# Patient Record
Sex: Female | Born: 1965 | Race: Black or African American | Hispanic: No | Marital: Single | Smoking: Never smoker
Health system: Southern US, Community
[De-identification: ages and names within clinical notes are randomized; demographics above are authoritative.]

## PROBLEM LIST (undated history)

## (undated) DIAGNOSIS — G4733 Obstructive sleep apnea (adult) (pediatric): Secondary | ICD-10-CM

## (undated) DIAGNOSIS — J302 Other seasonal allergic rhinitis: Secondary | ICD-10-CM

## (undated) DIAGNOSIS — J45909 Unspecified asthma, uncomplicated: Secondary | ICD-10-CM

## (undated) DIAGNOSIS — E559 Vitamin D deficiency, unspecified: Secondary | ICD-10-CM

## (undated) DIAGNOSIS — M7989 Other specified soft tissue disorders: Secondary | ICD-10-CM

## (undated) DIAGNOSIS — R0602 Shortness of breath: Secondary | ICD-10-CM

## (undated) DIAGNOSIS — E039 Hypothyroidism, unspecified: Secondary | ICD-10-CM

## (undated) DIAGNOSIS — E739 Lactose intolerance, unspecified: Secondary | ICD-10-CM

## (undated) DIAGNOSIS — M2550 Pain in unspecified joint: Secondary | ICD-10-CM

## (undated) HISTORY — DX: Obstructive sleep apnea (adult) (pediatric): G47.33

## (undated) HISTORY — DX: Other specified soft tissue disorders: M79.89

## (undated) HISTORY — DX: Hypothyroidism, unspecified: E03.9

## (undated) HISTORY — DX: Lactose intolerance, unspecified: E73.9

## (undated) HISTORY — DX: Pain in unspecified joint: M25.50

## (undated) HISTORY — DX: Shortness of breath: R06.02

## (undated) HISTORY — PX: ABDOMINAL HYSTERECTOMY: SHX81

## (undated) HISTORY — DX: Vitamin D deficiency, unspecified: E55.9

---

## 2005-08-28 ENCOUNTER — Ambulatory Visit (HOSPITAL_COMMUNITY): Admission: RE | Admit: 2005-08-28 | Discharge: 2005-08-28 | Payer: Self-pay | Admitting: Gastroenterology

## 2005-09-03 ENCOUNTER — Encounter: Admission: RE | Admit: 2005-09-03 | Discharge: 2005-09-03 | Payer: Self-pay | Admitting: Gastroenterology

## 2005-09-27 ENCOUNTER — Ambulatory Visit (HOSPITAL_COMMUNITY): Admission: RE | Admit: 2005-09-27 | Discharge: 2005-09-27 | Payer: Self-pay | Admitting: *Deleted

## 2005-09-27 ENCOUNTER — Encounter (INDEPENDENT_AMBULATORY_CARE_PROVIDER_SITE_OTHER): Payer: Self-pay | Admitting: Specialist

## 2005-10-02 ENCOUNTER — Ambulatory Visit (HOSPITAL_COMMUNITY): Admission: RE | Admit: 2005-10-02 | Discharge: 2005-10-02 | Payer: Self-pay | Admitting: *Deleted

## 2009-12-21 ENCOUNTER — Emergency Department (HOSPITAL_COMMUNITY): Admission: EM | Admit: 2009-12-21 | Discharge: 2009-12-21 | Payer: Self-pay | Admitting: Emergency Medicine

## 2010-11-30 ENCOUNTER — Emergency Department (HOSPITAL_COMMUNITY): Payer: BC Managed Care – PPO

## 2010-11-30 ENCOUNTER — Emergency Department (HOSPITAL_COMMUNITY)
Admission: EM | Admit: 2010-11-30 | Discharge: 2010-11-30 | Disposition: A | Discharge status: Home / Self Care | Payer: BC Managed Care – PPO | Attending: Emergency Medicine | Admitting: Emergency Medicine

## 2010-11-30 DIAGNOSIS — X58XXXA Exposure to other specified factors, initial encounter: Secondary | ICD-10-CM | POA: Insufficient documentation

## 2010-11-30 DIAGNOSIS — S335XXA Sprain of ligaments of lumbar spine, initial encounter: Secondary | ICD-10-CM | POA: Insufficient documentation

## 2010-11-30 DIAGNOSIS — E039 Hypothyroidism, unspecified: Secondary | ICD-10-CM | POA: Insufficient documentation

## 2010-11-30 DIAGNOSIS — M549 Dorsalgia, unspecified: Secondary | ICD-10-CM | POA: Insufficient documentation

## 2010-11-30 LAB — URINALYSIS, ROUTINE W REFLEX MICROSCOPIC
Hgb urine dipstick: NEGATIVE
Ketones, ur: NEGATIVE mg/dL
Nitrite: NEGATIVE
Specific Gravity, Urine: 1.014 (ref 1.005–1.030)
Urobilinogen, UA: 0.2 mg/dL (ref 0.0–1.0)
pH: 6.5 (ref 5.0–8.0)

## 2010-11-30 LAB — URINE MICROSCOPIC-ADD ON

## 2010-12-14 NOTE — Op Note (Signed)
NAME:  Anne Fitzpatrick, Anne Fitzpatrick               ACCOUNT NO.:  [id number]   MEDICAL RECORD NO.:  [id number]          PATIENT TYPE:  AMB   LOCATION:  SDC                           FACILITY:  [facility]   PHYSICIAN:  Bascom B. Earlene Plater, M.D.  DATE OF BIRTH:  1966/03/24   DATE OF PROCEDURE:  10/02/2005  DATE OF DISCHARGE:                                 OPERATIVE REPORT   PREOPERATIVE DIAGNOSES:  1.  Dysmenorrhea.  2.  Menorrhagia.   POSTOPERATIVE DIAGNOSES:  1.  Dysmenorrhea.  2.  Menorrhagia.   OPERATION/PROCEDURE:  Open diagnostic laparoscopy.   SURGEON:  Chester Holstein. Earlene Plater, M.D.   ASSISTANT:  Lenoard Aden, M.D.   ANESTHESIA:  General.   SPECIMENS:  None.   ESTIMATED BLOOD LOSS:  None.   COMPLICATIONS:  None.   INDICATIONS:  The patient presented for a laparoscopic supracervical  hysterectomy with the above symptoms.  Previous hysteroscopy and dilatation  and curettage showed benign proliferative endometrium.  No signs of  hyperplasia or carcinoma.  No history of abnormal Pap smears.  The patient  was advised of the risks of surgery including infection, bleeding, damage to  surrounding organs, and potential need for a laparotomy.   DESCRIPTION OF PROCEDURE:  The patient was taken to the operating room and  general anesthesia was obtained.  She was placed in the Combs stirrups and  prepped and draped in the standard fashion.  Foley catheter inserted in the  bladder.  Hulka tenaculum was attached to the anterior lip of the cervix.   A 10 mm incision was placed in the umbilicus, carried sharply to the fascia.  The fascia was divided sharply and elevated with Kocher clamps.  Posterior  sheath and peritoneum were elevated sharply.  Omental adhesions of the  umbilicus were taken down sharply.  Pursestring suture  of 0 Vicryl placed  around the fascial defect.  Hasson cannula inserted and secured.  Pneumoperitoneum obtained with CO2 gas.  Trendelenburg position obtained.  An 11 mm Excel trocar  was placed in the left lower quadrant and 5 mm in the  right, each under direct laparoscopic visualization.  Pelvis was inspected  and the uterus was enlarged consistent with fibroids, approximately 12-13  week size.  However, the uterus was densely adherent to the rectum  posteriorly.  There was findings consistent with endometriosis in this same  area.  In addition, the left tube and ovary were adherent to the left pelvic  sidewall.  There were no mobility and no obvious plane of dissection to  safely take down laparoscopically or with laparotomy considering the patient  has not had a bowel prep.  The upper abdomen was inspected and was normal.  Given that the patient had not had a bowel prep and there were dense  adhesions to the rectum, I terminated the procedure.   The inferior pots were removed, their sites inspected.  There were  hemostatic.  Scope was removed and gas released.  Hasson cannula removed.  The umbilical incision was elevated with an [employer] retractor.  Pursestring suture closed down.  This obliterated the fascial defect.  No  intra-abdominal contents were noted prior to closure.  Skin was closed in  the umbilicus with 4-0 Vicryl.  Skin was closed with anterior ports with  Dermabond.  Hulka removed.  Cervix was hemostatic.   The patient tolerated the procedure well. There were no complications.  She  was taken to the recovery room awake, alert and in stable condition.      Gerri Spore B. Earlene Plater, M.D.  Electronically Signed     WBD/MEDQ  D:  10/02/2005  T:  10/02/2005  Job:  [id number]

## 2010-12-14 NOTE — Op Note (Signed)
NAME:  Anne Fitzpatrick, Anne Fitzpatrick               ACCOUNT NO.:  [id number]   MEDICAL RECORD NO.:  [id number]          PATIENT TYPE:  AMB   LOCATION:  ENDO                         FACILITY:  [facility]   PHYSICIAN:  Anselmo Rod, M.D.  DATE OF BIRTH:  [birth year]   DATE OF PROCEDURE:  08/28/2005  DATE OF DISCHARGE:                                 OPERATIVE REPORT   PROCEDURE:  Esophagogastroduodenoscopy.   ENDOSCOPIST:  Charna Elizabeth, M.D.   INSTRUMENT USED:  Olympus video panendoscope.   INDICATIONS FOR PROCEDURE:  45 year old African American female with a  history of severe epigastric pain and nausea radiating to her back  undergoing an EGD to rule out esophagitis versus peptic ulcer disease from  non-steroidal use.   PREPROCEDURE PREPARATION:  Informed consent was obtained from the patient.  The patient was fasted for eight hours prior to the procedure.  The risks  and benefits of the procedure were discussed with the patient.   PREPROCEDURE PHYSICAL:  Patient with stable vital signs.  Neck supple.  Chest clear to auscultation.  S1 and S2 regular.  Abdomen soft with normal  bowel sounds.   DESCRIPTION OF PROCEDURE:  The patient was placed in the left lateral  decubitus position, sedated with 60 mcg of Fentanyl and 6 mg Versed in slow  incremental doses.  Once the patient was adequately sedated, maintained on  low flow oxygen and continuous cardiac monitoring, the Olympus video  panendoscope was advanced through the mouth piece over the tongue into the  esophagus under direct vision.  The entire esophagus appeared normal with no  evidence of ring, strictures, masses, esophagitis, or Barrett's mucosa.  The  esophagus was widely patent.  The gastric mucosa appeared healthy.  A small  hiatal hernia was noted on high retroflexion.  The rest of the gastric  mucosa and proximal small bowel appeared normal.  There was no obstruction.   IMPRESSION:  Normal esophagogastroduodenoscopy except for small  hiatal  hernia, no masses, ulcers, erosions, etc., seen.   RECOMMENDATIONS:  1.Proceed with a HIDA scan.  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  Therefore, in view of the fact that she  has had a normal gallbladder ultrasound on August 21, 2005, the plans are  to proceed with a HIDA scan with CBD injection.  Further recommendations  will be made thereafter.  A low fat diet has been recommended for now.  2.Follow anti-reflux measures, avoid all non-steroidals.  3.Continue PPI.  4.Outpatient follow up after the HIDA scan has been done.      Anselmo Rod, M.D.  Electronically Signed     JNM/MEDQ  D:  08/28/2005  T:  08/28/2005  Job:  [id number]   cc:   Gerri Spore B. Earlene Plater, M.D.  Fax: [phone]   Kirstie Peri, MD  Fax: [phone]

## 2010-12-14 NOTE — Op Note (Signed)
NAME:  Anne Fitzpatrick, Anne Fitzpatrick               ACCOUNT NO.:  [id number]   MEDICAL RECORD NO.:  [id number]          PATIENT TYPE:  AMB   LOCATION:  SDC                           FACILITY:  [facility]   PHYSICIAN:  [place] B. Earlene Plater, M.D.  DATE OF BIRTH:  May 06, 1966   DATE OF PROCEDURE:  09/27/2005  DATE OF DISCHARGE:                                 OPERATIVE REPORT   PREOPERATIVE DIAGNOSIS:  Abnormal uterine bleeding.   POSTOPERATIVE DIAGNOSIS:  Abnormal uterine bleeding.   OPERATION/PROCEDURE:  Hysteroscopy and dilatation and curettage with  ultrasound guidance.   SURGEON:  Chester Holstein. Earlene Plater, M.D.   ASSISTANT:  None.   ANESTHESIA:  LMA, general and 10 mL 1% Nesacaine paracervical block.   SPECIMENS:  Endometrial curettings.   ESTIMATED BLOOD LOSS:  Minimal.   COMPLICATIONS:  None.   INDICATIONS:  The patient with a history of heavy and irregular menstrual  periods.  She was planning for hysterectomy next week.  After a preoperative  visit endometrial biopsy was attempted but the cavity could not be entered.  A second attempt made with ultrasound guidance and was still unsuccessful.  Therefore, prior to her hysterectomy to rule out any significant endometrial  pathology, the patient presents for dilatation and curettage under  ultrasound guidance and hysteroscopy.  The patient was advised of the risks  of surgery including infection, bleeding, uterine perforation, and damage to  surrounding organs.   DESCRIPTION OF PROCEDURE:  The patient was taken to the operating room and  LMA general anesthesia was obtained.  She was prepped and draped in the  standard fashion and the bladder kept full to facilitate ultrasound  guidance.  Speculum inserted.  Paracervical block placed.  Single-tooth  tenaculum attached to the anterior lip of the cervix.  Under ultrasound  guidance, the uterus was sounded to about 7 cm.  It was noted to sharply  turn from the anterior cervical os anteriorly into the uterine  cavity and  this was apparently why it was difficult in the office.  Cervix was then  easily dilated under ultrasound guidance to #21.  The endometrium was then  curetted under ultrasound guidance and scanty amount of tissue returned.  The hysteroscope was inserted into the cavity without difficulty.  However,  good uterine distention could not be obtained.  What could be seen was a  shaggy endometrium and no focal mass.   The procedure was terminated.  The patient tolerated the procedure well.  There were no complications.  Instruments were removed and cervix was  hemostatic. She was taken to the recovery room awake, alert, in stable  condition.      Gerri Spore B. Earlene Plater, M.D.  Electronically Signed     WBD/MEDQ  D:  09/27/2005  T:  09/28/2005  Job:  [id number]

## 2011-05-23 ENCOUNTER — Emergency Department (HOSPITAL_COMMUNITY)
Admission: EM | Admit: 2011-05-23 | Discharge: 2011-05-24 | Disposition: A | Discharge status: Home / Self Care | Payer: BC Managed Care – PPO | Attending: Emergency Medicine | Admitting: Emergency Medicine

## 2011-05-23 DIAGNOSIS — E039 Hypothyroidism, unspecified: Secondary | ICD-10-CM | POA: Insufficient documentation

## 2011-05-23 DIAGNOSIS — R0989 Other specified symptoms and signs involving the circulatory and respiratory systems: Secondary | ICD-10-CM | POA: Insufficient documentation

## 2011-05-23 DIAGNOSIS — Z79899 Other long term (current) drug therapy: Secondary | ICD-10-CM | POA: Insufficient documentation

## 2011-05-23 DIAGNOSIS — M7989 Other specified soft tissue disorders: Secondary | ICD-10-CM | POA: Insufficient documentation

## 2011-05-23 DIAGNOSIS — R0609 Other forms of dyspnea: Secondary | ICD-10-CM | POA: Insufficient documentation

## 2011-05-24 ENCOUNTER — Emergency Department (HOSPITAL_COMMUNITY): Payer: BC Managed Care – PPO

## 2011-05-24 ENCOUNTER — Encounter (HOSPITAL_COMMUNITY): Payer: Self-pay

## 2011-05-24 LAB — DIFFERENTIAL
Lymphocytes Relative: 37 % (ref 12–46)
Lymphs Abs: 2.1 10*3/uL (ref 0.7–4.0)
Monocytes Relative: 8 % (ref 3–12)
Neutro Abs: 2.8 10*3/uL (ref 1.7–7.7)
Neutrophils Relative %: 50 % (ref 43–77)

## 2011-05-24 LAB — CBC
MCH: 28 pg (ref 26.0–34.0)
MCHC: 31.8 g/dL (ref 30.0–36.0)
MCV: 88.1 fL (ref 78.0–100.0)
Platelets: 305 10*3/uL (ref 150–400)
RDW: 14.2 % (ref 11.5–15.5)

## 2011-05-24 LAB — D-DIMER, QUANTITATIVE: D-Dimer, Quant: 0.62 ug/mL-FEU — ABNORMAL HIGH (ref 0.00–0.48)

## 2011-05-24 LAB — BASIC METABOLIC PANEL
Calcium: 9.4 mg/dL (ref 8.4–10.5)
Chloride: 104 mEq/L (ref 96–112)

## 2011-05-24 MED ORDER — IOHEXOL 300 MG/ML  SOLN
125.0000 mL | Freq: Once | INTRAMUSCULAR | Status: AC | PRN
  Administered 2011-05-24: 125 mL via INTRAVENOUS

## 2012-11-07 IMAGING — CR DG LUMBAR SPINE COMPLETE 4+V
5 series · 5 of 5 positions shown · non-contrast
Comparison: None.

CLINICAL DATA: Obesity, back pain

LUMBAR SPINE - COMPLETE 4+ VIEW

[t l-spine a.p.]
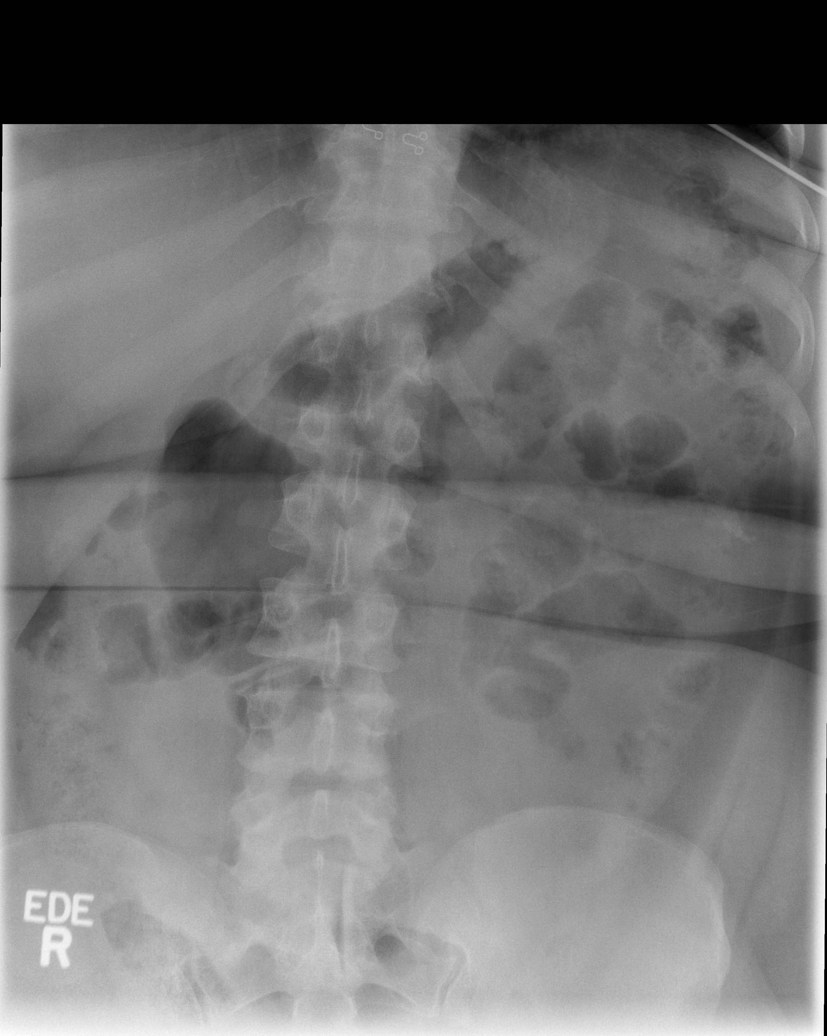

[t l-spine oblique exposure (1 of 2)]
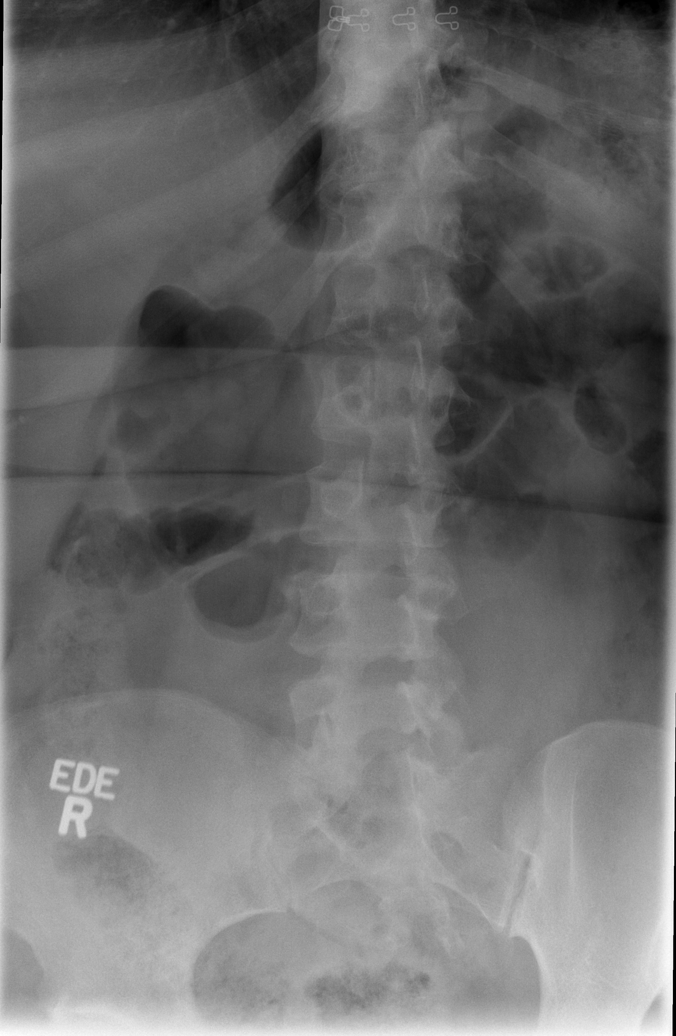

[t l-spine oblique exposure (2 of 2)]
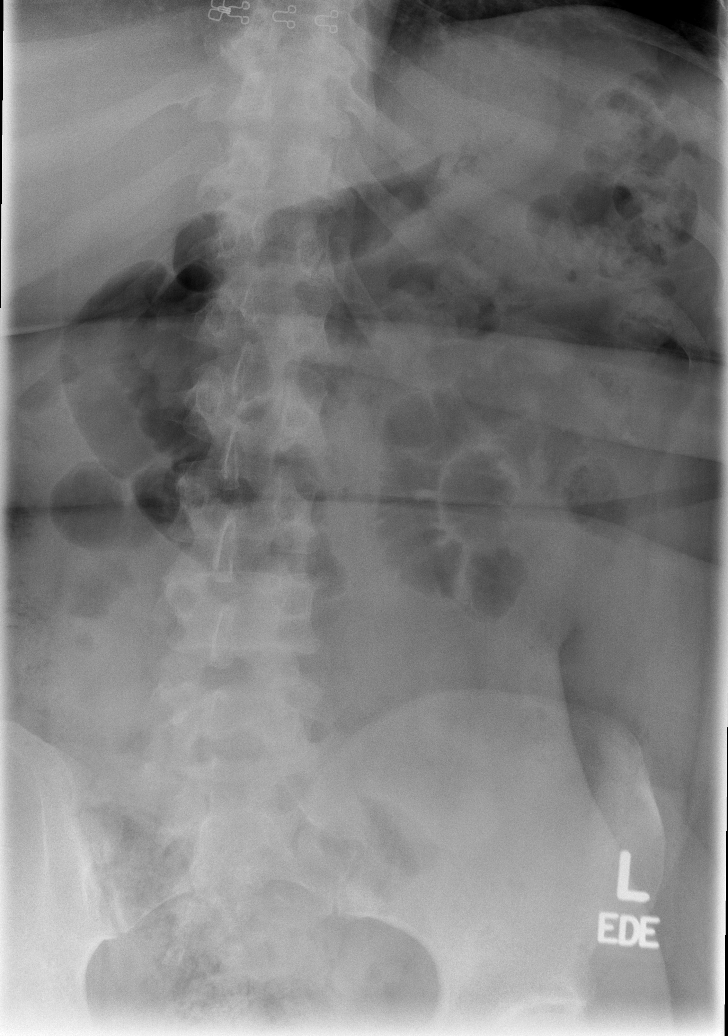

[t l-spine lat]
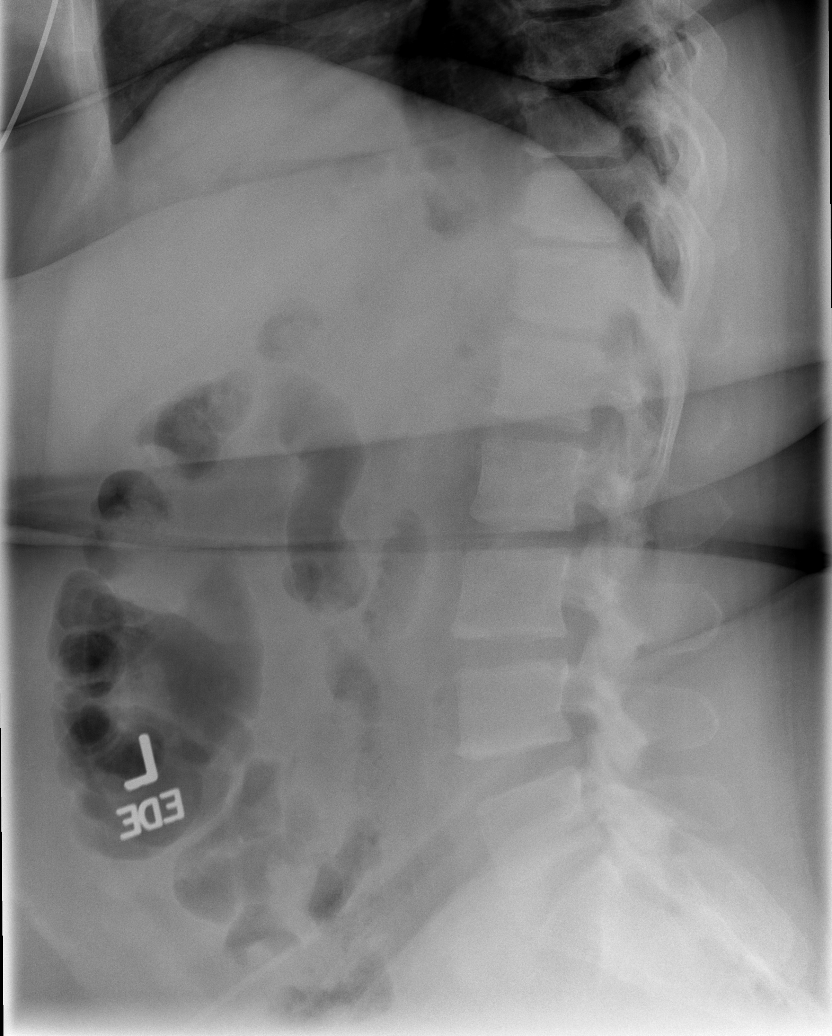

[t l-spine l5-s1 spot *]
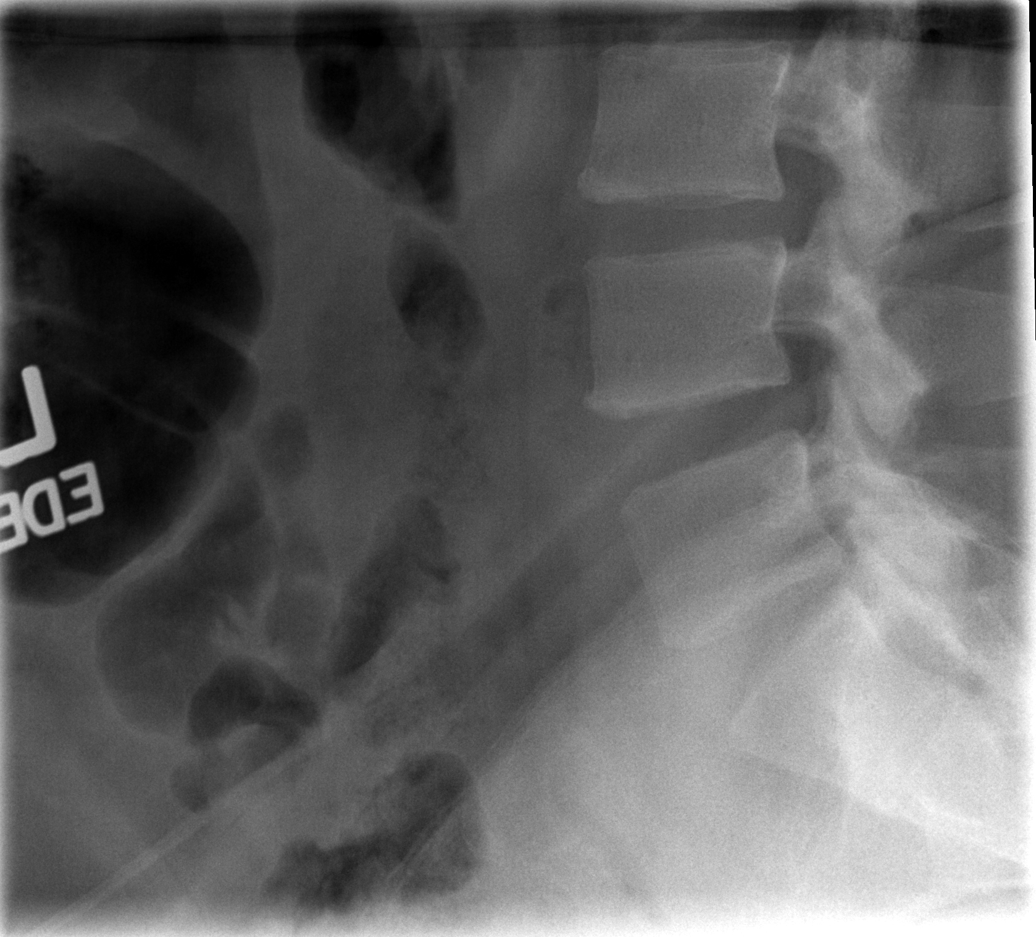

[5 of 5 positions shown; findings below may reference images not displayed]

FINDINGS: Mild lower thoracic and lumbar endplate degenerative
changes.  Normal alignment.  No compression fracture, wedge shaped
deformity or focal kyphosis.  Preserved disc spaces.  Slight facet
arthropathy suspected at L5 S1.  No pars defects.  Intact pedicles.
IMPRESSION: Slight degenerative changes.  No acute finding by plain
radiography.

## 2013-03-14 DIAGNOSIS — R0602 Shortness of breath: Secondary | ICD-10-CM

## 2013-05-30 ENCOUNTER — Encounter (HOSPITAL_COMMUNITY): Payer: Self-pay | Admitting: Emergency Medicine

## 2013-05-30 ENCOUNTER — Emergency Department (HOSPITAL_COMMUNITY): Payer: BC Managed Care – PPO

## 2013-05-30 ENCOUNTER — Emergency Department (HOSPITAL_COMMUNITY)
Admission: EM | Admit: 2013-05-30 | Discharge: 2013-05-30 | Disposition: A | Discharge status: Home / Self Care | Payer: BC Managed Care – PPO | Attending: Emergency Medicine | Admitting: Emergency Medicine

## 2013-05-30 DIAGNOSIS — H9209 Otalgia, unspecified ear: Secondary | ICD-10-CM | POA: Insufficient documentation

## 2013-05-30 DIAGNOSIS — E669 Obesity, unspecified: Secondary | ICD-10-CM | POA: Insufficient documentation

## 2013-05-30 DIAGNOSIS — Z79899 Other long term (current) drug therapy: Secondary | ICD-10-CM | POA: Insufficient documentation

## 2013-05-30 DIAGNOSIS — E039 Hypothyroidism, unspecified: Secondary | ICD-10-CM | POA: Insufficient documentation

## 2013-05-30 DIAGNOSIS — J45909 Unspecified asthma, uncomplicated: Secondary | ICD-10-CM | POA: Insufficient documentation

## 2013-05-30 HISTORY — DX: Unspecified asthma, uncomplicated: J45.909

## 2013-05-30 HISTORY — DX: Hypothyroidism, unspecified: E03.9

## 2013-05-30 HISTORY — DX: Other seasonal allergic rhinitis: J30.2

## 2013-05-30 MED ORDER — FLUTICASONE PROPIONATE HFA 110 MCG/ACT IN AERO: 1.0000 | INHALATION_SPRAY | Freq: Two times a day (BID) | RESPIRATORY_TRACT | Status: DC

## 2013-05-30 NOTE — ED Notes (Signed)
Pt c/o ear pressure, cough that was productive with green sputum, sob, chest pain with the coughing and deep respirations since Thursday. Pt states "I feel congestion in my chest",

## 2013-05-30 NOTE — ED Provider Notes (Signed)
CSN: [id number]     Arrival date & time 05/30/13  1018 History   First MD Initiated Contact with Patient 05/30/13 1301     Chief Complaint  Patient presents with  . Cough   (Consider location/radiation/quality/duration/timing/severity/associated sxs/prior Treatment) Patient is a 47 y.o. female presenting with cough. The history is provided by the patient.  Cough Onset quality:  Sudden Timing:  Constant Chronicity:  Chronic Smoker: no   Associated symptoms: chest pain and ear pain   Associated symptoms: no headaches, no rash and no shortness of breath    patient has had cough since April. She states she was seen in ED and given steroids antibiotics and breathing felt better. She states she's continued to have symptoms. She has had cough with minimal production. States her ears hurt. She states her head feels congested it hurts. Swelling or legs. She does not smoke. She states she has a history of asthma and allergies. She states she was feeling better when she was on Zyrtec-D, but that D was made in her blood pressure go up so she is now only on Zyrtec and Singulair. No fevers. No lightheadedness or dizziness.  Past Medical History  Diagnosis Date  . Hypothyroid   . Asthma   . Seasonal allergies    Past Surgical History  Procedure Laterality Date  . Abdominal hysterectomy     No family history on file. History  Substance Use Topics  . Smoking status: Never Smoker   . Smokeless tobacco: Not on file  . Alcohol Use: No   OB History   Grav Para Term Preterm Abortions TAB SAB Ect Mult Living                 Review of Systems  Constitutional: Negative for activity change and appetite change.  HENT: Positive for ear pain and sinus pressure.   Eyes: Negative for pain.  Respiratory: Positive for cough and chest tightness. Negative for shortness of breath.   Cardiovascular: Positive for chest pain. Negative for leg swelling.  Gastrointestinal: Negative for nausea, vomiting,  abdominal pain and diarrhea.  Genitourinary: Negative for flank pain.  Musculoskeletal: Negative for back pain and neck stiffness.  Skin: Negative for rash.  Neurological: Negative for weakness, numbness and headaches.  Psychiatric/Behavioral: Negative for behavioral problems.    Allergies  Review of patient's allergies indicates no known allergies.  Home Medications   Current Outpatient Rx  Name  Route  Sig  Dispense  Refill  . cetirizine (ZYRTEC) 10 MG tablet   Oral   Take 10 mg by mouth daily.         . cholecalciferol (VITAMIN D) 1000 UNITS tablet   Oral   Take 1,000 Units by mouth daily.         Marland Kitchen guaiFENesin (MUCINEX) 600 MG 12 hr tablet   Oral   Take 1,200 mg by mouth 2 (two) times daily.         . Lorcaserin HCl (BELVIQ) 10 MG TABS   Oral   Take 1 tablet by mouth daily.         . montelukast (SINGULAIR) 10 MG tablet   Oral   Take 1 tablet by mouth daily.         . Multiple Vitamin (MULTIVITAMIN WITH MINERALS) TABS tablet   Oral   Take 1 tablet by mouth daily.         Marland Kitchen SYNTHROID 25 MCG tablet   Oral   Take 1 tablet by mouth daily.         Marland Kitchen  fluticasone (FLOVENT HFA) 110 MCG/ACT inhaler   Inhalation   Inhale 1 puff into the lungs 2 (two) times daily.   1 Inhaler   1    BP 126/97  Pulse 67  Temp(Src) 98.1 F (36.7 C) (Oral)  Resp 20  SpO2 99% Physical Exam  Nursing note and vitals reviewed. Constitutional: She is oriented to person, place, and time. She appears well-developed and well-nourished.  Patient is obese  HENT:  Head: Normocephalic and atraumatic.  Eyes: EOM are normal. Pupils are equal, round, and reactive to light.  Neck: Normal range of motion. Neck supple.  Cardiovascular: Normal rate, regular rhythm and normal heart sounds.   No murmur heard. Pulmonary/Chest: Effort normal and breath sounds normal. No respiratory distress. She has no wheezes. She has no rales.  Abdominal: Soft. Bowel sounds are normal. She exhibits  no distension. There is no tenderness. There is no rebound and no guarding.  Musculoskeletal: Normal range of motion.  Neurological: She is alert and oriented to person, place, and time. No cranial nerve deficit.  Skin: Skin is warm and dry.  Psychiatric: She has a normal mood and affect. Her speech is normal.    ED Course  Procedures (including critical care time) Labs Review Labs Reviewed - No data to display Imaging Review Dg Chest 2 View  05/30/2013   CLINICAL DATA:  Cough. Chest congestion. Asthma. Seasonal allergies.  EXAM: CHEST  2 VIEW  COMPARISON:  04/15/2013  FINDINGS: The heart size and mediastinal contours are within normal limits. Both lungs are clear. The visualized skeletal structures are unremarkable.  IMPRESSION: No active cardiopulmonary disease.   Electronically Signed   By: Myles Rosenthal M.D.   On: 05/30/2013 11:25    EKG Interpretation   None       MDM   1. Asthma    Patient with likely exacerbation of her asthma versus allergic dyspnea. Will adjust medications. Do not feel this is cardiac in cause. X-ray does not show pneumonia. Will add inhaled steroid additionally to follow with her primary care Dr. No current wheezes.    Juliet Rude. Rubin Payor, MD 05/30/13 1344

## 2013-05-30 NOTE — ED Notes (Signed)
Pt c/o chest congestion and cough since Wednesday. Pt states "I feel congested in my chest and I have been trying to cough up everything but I can't". Pt also reports SOB. Pt has inhaler at home but denies relief when using it.

## 2013-06-03 ENCOUNTER — Ambulatory Visit: Payer: BC Managed Care – PPO | Admitting: Dietician

## 2015-06-08 ENCOUNTER — Other Ambulatory Visit: Payer: Self-pay | Admitting: Surgical Oncology

## 2015-07-18 ENCOUNTER — Other Ambulatory Visit: Payer: Self-pay | Admitting: Endocrinology

## 2015-07-18 DIAGNOSIS — E049 Nontoxic goiter, unspecified: Secondary | ICD-10-CM

## 2015-07-26 ENCOUNTER — Other Ambulatory Visit: Payer: BC Managed Care – PPO

## 2015-07-28 ENCOUNTER — Ambulatory Visit
Admission: RE | Admit: 2015-07-28 | Discharge: 2015-07-28 | Disposition: A | Discharge status: Home / Self Care | Payer: BC Managed Care – PPO | Source: Ambulatory Visit | Attending: Endocrinology | Admitting: Endocrinology

## 2015-07-28 DIAGNOSIS — E049 Nontoxic goiter, unspecified: Secondary | ICD-10-CM

## 2015-09-07 ENCOUNTER — Ambulatory Visit: Payer: Self-pay | Admitting: Allergy and Immunology

## 2015-09-20 ENCOUNTER — Ambulatory Visit: Payer: Self-pay | Admitting: Allergy and Immunology

## 2015-10-03 ENCOUNTER — Ambulatory Visit: Payer: Self-pay | Admitting: Allergy and Immunology

## 2015-10-24 ENCOUNTER — Encounter (HOSPITAL_COMMUNITY): Payer: Self-pay

## 2015-10-24 ENCOUNTER — Emergency Department (HOSPITAL_COMMUNITY)
Admission: EM | Admit: 2015-10-24 | Discharge: 2015-10-24 | Disposition: A | Discharge status: Home / Self Care | Payer: BC Managed Care – PPO | Attending: Emergency Medicine | Admitting: Emergency Medicine

## 2015-10-24 DIAGNOSIS — J45909 Unspecified asthma, uncomplicated: Secondary | ICD-10-CM | POA: Diagnosis not present

## 2015-10-24 DIAGNOSIS — M549 Dorsalgia, unspecified: Secondary | ICD-10-CM | POA: Diagnosis present

## 2015-10-24 LAB — URINALYSIS, ROUTINE W REFLEX MICROSCOPIC
BILIRUBIN URINE: NEGATIVE
Glucose, UA: NEGATIVE mg/dL
HGB URINE DIPSTICK: NEGATIVE
Ketones, ur: NEGATIVE mg/dL
Leukocytes, UA: NEGATIVE
Nitrite: NEGATIVE
PROTEIN: NEGATIVE mg/dL
SPECIFIC GRAVITY, URINE: 1.008 (ref 1.005–1.030)
pH: 7 (ref 5.0–8.0)

## 2015-10-24 NOTE — ED Notes (Signed)
Pt notified registration that she was not staying

## 2015-10-24 NOTE — ED Notes (Signed)
Pt has hx of UTI.  Pt went to MD yesterday and was cleared for UTI.  Given muscle relaxer.  Back hurts when attempting to urinate.  Difficulty walking. No new trauma

## 2015-11-03 ENCOUNTER — Other Ambulatory Visit: Payer: Self-pay | Admitting: Surgical Oncology

## 2015-11-03 DIAGNOSIS — J4530 Mild persistent asthma, uncomplicated: Secondary | ICD-10-CM

## 2015-11-07 ENCOUNTER — Other Ambulatory Visit: Payer: BC Managed Care – PPO

## 2015-11-15 ENCOUNTER — Ambulatory Visit
Admission: RE | Admit: 2015-11-15 | Discharge: 2015-11-15 | Disposition: A | Discharge status: Home / Self Care | Payer: BC Managed Care – PPO | Source: Ambulatory Visit | Attending: Surgical Oncology | Admitting: Surgical Oncology

## 2015-11-15 DIAGNOSIS — J4530 Mild persistent asthma, uncomplicated: Secondary | ICD-10-CM

## 2015-11-16 ENCOUNTER — Other Ambulatory Visit: Payer: Self-pay | Admitting: Neurosurgery

## 2015-11-16 DIAGNOSIS — M5137 Other intervertebral disc degeneration, lumbosacral region: Secondary | ICD-10-CM

## 2015-11-23 ENCOUNTER — Other Ambulatory Visit: Payer: BC Managed Care – PPO

## 2015-11-25 ENCOUNTER — Ambulatory Visit
Admission: RE | Admit: 2015-11-25 | Discharge: 2015-11-25 | Disposition: A | Discharge status: Home / Self Care | Payer: BC Managed Care – PPO | Source: Ambulatory Visit | Attending: Neurosurgery | Admitting: Neurosurgery

## 2015-11-25 DIAGNOSIS — M5137 Other intervertebral disc degeneration, lumbosacral region: Secondary | ICD-10-CM

## 2016-07-17 ENCOUNTER — Other Ambulatory Visit: Payer: Self-pay | Admitting: Endocrinology

## 2016-07-17 DIAGNOSIS — E049 Nontoxic goiter, unspecified: Secondary | ICD-10-CM

## 2016-10-09 ENCOUNTER — Ambulatory Visit
Admission: RE | Admit: 2016-10-09 | Discharge: 2016-10-09 | Disposition: A | Discharge status: Home / Self Care | Payer: BC Managed Care – PPO | Source: Ambulatory Visit | Attending: Endocrinology | Admitting: Endocrinology

## 2016-10-09 DIAGNOSIS — E049 Nontoxic goiter, unspecified: Secondary | ICD-10-CM

## 2018-04-02 ENCOUNTER — Other Ambulatory Visit: Payer: Self-pay | Admitting: Obstetrics and Gynecology

## 2018-04-02 DIAGNOSIS — Z1509 Genetic susceptibility to other malignant neoplasm: Principal | ICD-10-CM

## 2018-04-02 DIAGNOSIS — Z1501 Genetic susceptibility to malignant neoplasm of breast: Secondary | ICD-10-CM

## 2018-04-02 DIAGNOSIS — Z1231 Encounter for screening mammogram for malignant neoplasm of breast: Secondary | ICD-10-CM

## 2018-04-03 ENCOUNTER — Ambulatory Visit: Payer: BC Managed Care – PPO

## 2018-07-20 ENCOUNTER — Other Ambulatory Visit: Payer: Self-pay | Admitting: Obstetrics and Gynecology

## 2018-07-26 ENCOUNTER — Ambulatory Visit
Admission: RE | Admit: 2018-07-26 | Discharge: 2018-07-26 | Disposition: A | Discharge status: Home / Self Care | Payer: BC Managed Care – PPO | Source: Ambulatory Visit | Attending: Obstetrics and Gynecology | Admitting: Obstetrics and Gynecology

## 2018-07-26 DIAGNOSIS — Z1509 Genetic susceptibility to other malignant neoplasm: Principal | ICD-10-CM

## 2018-07-26 DIAGNOSIS — Z1501 Genetic susceptibility to malignant neoplasm of breast: Secondary | ICD-10-CM

## 2019-09-26 ENCOUNTER — Ambulatory Visit: Payer: BC Managed Care – PPO | Attending: Internal Medicine

## 2019-09-26 DIAGNOSIS — Z23 Encounter for immunization: Secondary | ICD-10-CM

## 2019-09-26 NOTE — Progress Notes (Signed)
   Covid-19 Vaccination Clinic  Name:  ARZU MCGAUGHEY    MRN: [id number] DOB: November 24, 1965  09/26/2019  Ms. Heo was observed post Covid-19 immunization for 15 minutes without incidence. She was provided with Vaccine Information Sheet and instruction to access the V-Safe system.   Ms. Gernert was instructed to call 911 with any severe reactions post vaccine: Marland Kitchen Difficulty breathing  . Swelling of your face and throat  . A fast heartbeat  . A bad rash all over your body  . Dizziness and weakness    Immunizations Administered    Name Date Dose VIS Date Route   Moderna COVID-19 Vaccine 09/26/2019 10:45 AM 0.5 mL 06/29/2019 Intramuscular   Manufacturer: Moderna   Lot: [id number]   NDC: 79150-569-79

## 2019-09-28 ENCOUNTER — Ambulatory Visit: Payer: BC Managed Care – PPO

## 2019-10-30 ENCOUNTER — Ambulatory Visit: Payer: BC Managed Care – PPO | Attending: Internal Medicine

## 2019-10-30 DIAGNOSIS — Z23 Encounter for immunization: Secondary | ICD-10-CM

## 2019-10-30 NOTE — Progress Notes (Signed)
   Covid-19 Vaccination Clinic  Name:  SINDIA KOWALCZYK    MRN: [id number] DOB: [birth year]  10/30/2019  Ms. Legaspi was observed post Covid-19 immunization for 15 minutes without incident. She was provided with Vaccine Information Sheet and instruction to access the V-Safe system.   Ms. Loughmiller was instructed to call 911 with any severe reactions post vaccine: Marland Kitchen Difficulty breathing  . Swelling of face and throat  . A fast heartbeat  . A bad rash all over body  . Dizziness and weakness   Immunizations Administered    Name Date Dose VIS Date Route   Moderna COVID-19 Vaccine 10/30/2019 11:39 AM 0.5 mL 06/29/2019 Intramuscular   Manufacturer: Moderna   Lot: [id number]   NDC: 17915-056-97

## 2021-05-27 ENCOUNTER — Encounter (HOSPITAL_COMMUNITY): Payer: Self-pay | Admitting: Emergency Medicine

## 2021-05-27 ENCOUNTER — Emergency Department (HOSPITAL_COMMUNITY)
Admission: EM | Admit: 2021-05-27 | Discharge: 2021-05-27 | Disposition: A | Discharge status: Home / Self Care | Payer: BC Managed Care – PPO | Attending: Emergency Medicine | Admitting: Emergency Medicine

## 2021-05-27 ENCOUNTER — Other Ambulatory Visit: Payer: Self-pay

## 2021-05-27 DIAGNOSIS — M25512 Pain in left shoulder: Secondary | ICD-10-CM | POA: Insufficient documentation

## 2021-05-27 DIAGNOSIS — Z79899 Other long term (current) drug therapy: Secondary | ICD-10-CM | POA: Diagnosis not present

## 2021-05-27 DIAGNOSIS — G8929 Other chronic pain: Secondary | ICD-10-CM | POA: Insufficient documentation

## 2021-05-27 DIAGNOSIS — E039 Hypothyroidism, unspecified: Secondary | ICD-10-CM | POA: Insufficient documentation

## 2021-05-27 DIAGNOSIS — J45909 Unspecified asthma, uncomplicated: Secondary | ICD-10-CM | POA: Insufficient documentation

## 2021-05-27 DIAGNOSIS — Z7952 Long term (current) use of systemic steroids: Secondary | ICD-10-CM | POA: Diagnosis not present

## 2021-05-27 MED ORDER — KETOROLAC TROMETHAMINE 15 MG/ML IJ SOLN
30.0000 mg | Freq: Once | INTRAMUSCULAR | Status: AC
  Administered 2021-05-27: 30 mg via INTRAMUSCULAR
  Filled 2021-05-27: qty 2

## 2021-05-27 MED ORDER — OXYCODONE-ACETAMINOPHEN 5-325 MG PO TABS: 1.0000 | ORAL_TABLET | Freq: Three times a day (TID) | ORAL | 0 refills | Status: DC | PRN

## 2021-05-27 MED ORDER — OXYCODONE-ACETAMINOPHEN 5-325 MG PO TABS
1.0000 | ORAL_TABLET | Freq: Once | ORAL | Status: AC
  Administered 2021-05-27: 1 via ORAL
  Filled 2021-05-27: qty 1

## 2021-05-27 NOTE — Progress Notes (Signed)
Orthopedic Tech Progress Note Patient Details:  Anne Fitzpatrick Nov 05, 1965 [id number]  Ortho Devices Type of Ortho Device: Sling and swathe Ortho Device/Splint Location: left Ortho Device/Splint Interventions: Application   Post Interventions Patient Tolerated: Well Instructions Provided: Care of device  Saul Fordyce 05/27/2021, 12:34 PM

## 2021-05-27 NOTE — Discharge Instructions (Addendum)
Return for any problem.  Use ibuprofen as instructed -600 mg taken orally every 8 hours - for anti-inflammatory effect.  If you have additional pain you may use Percocet as prescribed for pain control.  It is very important that you follow-up closely with orthopedics as instructed.

## 2021-05-27 NOTE — ED Triage Notes (Signed)
Pt arrives POV w/ c/o L shoulder pain that has felt sore x "a while" but has been unable to raise arm since Friday. Denies chest pain, denies SHOB. Denies injury to arm. Pt states pain is throbbing pain.

## 2021-05-27 NOTE — ED Provider Notes (Signed)
[facility]-EMERGENCY DEPT Provider Note   CSN: [id number] Arrival date & time: 05/27/21  1008     History No chief complaint on file.   Anne Fitzpatrick is a 55 y.o. female.  55 year old female with prior medical history as detailed below presents for evaluation of left shoulder pain.  Patient reports that her left shoulder has been bothering her for several weeks.  She reports pain to the anterior aspect of the left shoulder.  This pain typically was worse with prolonged sitting at a desk and using a computer.  Patient reports increased pain over the anterior aspect of the left shoulder since late last week.  Of note, patient was seen for same complaint in [facility] ED yesterday.  Imaging studies and labs - including troponin - were negative for significant pathology.  The history is provided by the patient.  Shoulder Pain Location:  Shoulder Shoulder location:  L shoulder Injury: no   Pain details:    Quality:  Aching   Radiates to:  Does not radiate   Severity:  Moderate   Onset quality:  Gradual   Duration:  4 weeks   Timing:  Constant   Progression:  Waxing and waning     Past Medical History:  Diagnosis Date   Asthma    Hypothyroid    Seasonal allergies     There are no problems to display for this patient.   Past Surgical History:  Procedure Laterality Date   ABDOMINAL HYSTERECTOMY       OB History   No obstetric history on file.     History reviewed. No pertinent family history.  Social History   Tobacco Use   Smoking status: Never  Substance Use Topics   Alcohol use: No   Drug use: No    Home Medications Prior to Admission medications   Medication Sig Start Date End Date Taking? Authorizing Provider  cetirizine (ZYRTEC) 10 MG tablet Take 10 mg by mouth daily.    [provider]  cholecalciferol (VITAMIN D) 1000 UNITS tablet Take 1,000 Units by mouth daily.    [provider]  fluticasone (FLOVENT HFA)  110 MCG/ACT inhaler Inhale 1 puff into the lungs 2 (two) times daily. 05/30/13   Benjiman Core, MD  guaiFENesin (MUCINEX) 600 MG 12 hr tablet Take 1,200 mg by mouth 2 (two) times daily.    [provider]  Lorcaserin HCl (BELVIQ) 10 MG TABS Take 1 tablet by mouth daily.    [provider]  montelukast (SINGULAIR) 10 MG tablet Take 1 tablet by mouth daily. 04/10/13   [provider]  Multiple Vitamin (MULTIVITAMIN WITH MINERALS) TABS tablet Take 1 tablet by mouth daily.    [provider]  SYNTHROID 25 MCG tablet Take 1 tablet by mouth daily. 04/10/13   [provider]    Allergies    Patient has no known allergies.  Review of Systems   Review of Systems  All other systems reviewed and are negative.  Physical Exam Updated Vital Signs BP (!) 156/133 (BP Location: Right Arm) Comment: nurse is informed of b/p  Pulse 90   Temp 98.1 F (36.7 C) (Oral)   Resp 18   SpO2 98%   Physical Exam Vitals and nursing note reviewed.  Constitutional:      General: She is not in acute distress.    Appearance: Normal appearance. She is well-developed.  HENT:     Head: Normocephalic and atraumatic.  Eyes:  Conjunctiva/sclera: Conjunctivae normal.     Pupils: Pupils are equal, round, and reactive to light.  Cardiovascular:     Rate and Rhythm: Normal rate and regular rhythm.     Heart sounds: Normal heart sounds.  Pulmonary:     Effort: Pulmonary effort is normal. No respiratory distress.     Breath sounds: Normal breath sounds.  Abdominal:     General: There is no distension.     Palpations: Abdomen is soft.     Tenderness: There is no abdominal tenderness.  Musculoskeletal:        General: Tenderness present. No deformity. Normal range of motion.     Cervical back: Normal range of motion and neck supple.     Comments: Significant tenderness with palpation over the anterior and lateral aspect of the left shoulder.  No bony step-off noted.  No  overlying erythema.  Distal left upper extremity is neurovascular intact.  Active range of motion and passive range of motion of the left shoulder are impaired secondary to patient's reported pain.  Skin:    General: Skin is warm and dry.  Neurological:     General: No focal deficit present.     Mental Status: She is alert and oriented to person, place, and time.    ED Results / Procedures / Treatments   Labs (all labs ordered are listed, but only abnormal results are displayed) Labs Reviewed - No data to display  EKG None  Radiology No results found.  Procedures Procedures   Medications Ordered in ED Medications  ketorolac (TORADOL) 15 MG/ML injection 30 mg (30 mg Intramuscular Given 05/27/21 1037)  oxyCODONE-acetaminophen (PERCOCET/ROXICET) 5-325 MG per tablet 1 tablet (1 tablet Oral Given 05/27/21 1037)    ED Course  I have reviewed the triage vital signs and the nursing notes.  Pertinent labs & imaging results that were available during my care of the patient were reviewed by me and considered in my medical decision making (see chart for details).    MDM Rules/Calculators/A&P                           MDM  MSE complete  Anne Fitzpatrick was evaluated in Emergency Department on 05/27/2021 for the symptoms described in the history of present illness. She was evaluated in the context of the global COVID-19 pandemic, which necessitated consideration that the patient might be at risk for infection with the SARS-CoV-2 virus that causes COVID-19. Institutional protocols and algorithms that pertain to the evaluation of patients at risk for COVID-19 are in a state of rapid change based on information released by regulatory bodies including the CDC and federal and state organizations. These policies and algorithms were followed during the patient's care in the ED.  Patient is presenting with chronic complaint of left shoulder pain.  Patient with recent evaluation in the  [facility] ED yesterday without evidence of significant pathology.  Patient is presenting today complaining of continued pain.  Patient was administered Toradol and 1 Percocet.  She reports significant reduction in her pain after this.  Patient is advised that she definitively needs to follow-up closely with orthopedics in the outpatient setting.  She does not need additional ED evaluation or screening at this time.  Importance of close follow-up is stressed.  Strict return precautions given and understood.    Final Clinical Impression(s) / ED Diagnoses Final diagnoses:  Chronic left shoulder pain    Rx / DC Orders ED  Discharge Orders          Ordered    oxyCODONE-acetaminophen (PERCOCET/ROXICET) 5-325 MG tablet  Every 8 hours PRN        05/27/21 1226             Wynetta Fines, MD 05/27/21 1230

## 2023-07-08 ENCOUNTER — Encounter (INDEPENDENT_AMBULATORY_CARE_PROVIDER_SITE_OTHER): Payer: Self-pay | Admitting: Otolaryngology

## 2023-07-28 LAB — HM MAMMOGRAPHY

## 2024-03-19 LAB — TSH: TSH: 2.8

## 2024-03-19 LAB — COMPREHENSIVE METABOLIC PANEL WITH GFR: EGFR: 96

## 2024-03-24 ENCOUNTER — Encounter (INDEPENDENT_AMBULATORY_CARE_PROVIDER_SITE_OTHER): Payer: Self-pay | Admitting: Adult Health

## 2024-03-24 ENCOUNTER — Ambulatory Visit (INDEPENDENT_AMBULATORY_CARE_PROVIDER_SITE_OTHER): Admitting: Adult Health

## 2024-03-24 ENCOUNTER — Encounter (INDEPENDENT_AMBULATORY_CARE_PROVIDER_SITE_OTHER): Payer: Self-pay

## 2024-03-24 DIAGNOSIS — Z6841 Body Mass Index (BMI) 40.0 and over, adult: Secondary | ICD-10-CM

## 2024-03-24 DIAGNOSIS — Z0000 Encounter for general adult medical examination without abnormal findings: Secondary | ICD-10-CM

## 2024-03-24 DIAGNOSIS — Z0289 Encounter for other administrative examinations: Secondary | ICD-10-CM

## 2024-03-24 DIAGNOSIS — R600 Localized edema: Secondary | ICD-10-CM

## 2024-03-24 NOTE — Progress Notes (Signed)
 Office: [phone]  /  Fax: [phone]   Initial Visit    Anne Fitzpatrick was seen in clinic today to evaluate for obesity. She is interested in losing weight to improve overall health and reduce the risk of weight related complications. She presents today to review program treatment options, initial physical assessment, and evaluation.     She lives with her 56 year old mother. She lost her father and brother in the last 12 months  She was referred by: Friend or Family (her hairdresser, whom is a pt at [facility], [place])  When asked what else they would like to accomplish? She states: Adopt a healthier eating pattern and lifestyle, Improve energy levels and physical activity, Improve existing medical conditions, and Improve quality of life  When asked how has your weight affected you? She states: Contributed to medical problems, Contributed to orthopedic problems or mobility issues, Having fatigue, Having poor endurance, Problems with eating patterns, and Has affected mood   Weight history: Lifelong challenged with elevated BMI  Highest weight: 395 lbs  Some associated conditions: Other: edema  Contributing factors: family history of obesity, disruption of circadian rhythm / sleep disordered breathing, consumption of processed foods, moderate to high levels of stress, reduced physical activity, chronic skipping of meals, menopause, sedentary job, hectic pace of life, and need for convenient foods  Weight promoting medications identified: None  Prior weight loss attempts: Weight Watchers, Randall Banks, Low Carb, Ketogenic, Tracking and Journaling, High Protein, and La Diet, Herbal Life  Current nutrition plan: Low-carb and High-protein  Current level of physical activity: None  Current or previous pharmacotherapy: Diethylpropion Er 75 Mg Tablet , Phentermine  Response to medication: Ineffective so it was discontinued and Had side effects so it was discontinued   Past medical history  includes:   Past Medical History:  Diagnosis Date   Asthma    Hypothyroid    Seasonal allergies      Objective    BP (!) 183/85   Pulse 74   Temp 97.6 F (36.4 C)   Ht 5' 4 (1.626 m)   Wt (!) 395 lb (179.2 kg)   SpO2 99%   BMI 67.80 kg/m  She was weighed on the bioimpedance scale: Body mass index is 67.8 kg/m.  Body Fat%:66.6, Visceral Fat Rating:33, Weight trend over the last 12 months: Increasing  General:  Alert, oriented and cooperative. Patient is in no acute distress.  Respiratory: Normal respiratory effort, no problems with respiration noted   Gait: able to ambulate independently  Mental Status: Normal mood and affect. Normal behavior. Normal judgment and thought content.   DIAGNOSTIC DATA REVIEWED:  BMET    Component Value Date/Time   NA 138 05/24/2011 0015   K 3.8 05/24/2011 0015   CL 104 05/24/2011 0015   CO2 25 05/24/2011 0015   GLUCOSE 94 05/24/2011 0015   BUN 15 05/24/2011 0015   CREATININE 0.75 05/24/2011 0015   CALCIUM 9.4 05/24/2011 0015   GFRNONAA >90 05/24/2011 0015   GFRAA >90 05/24/2011 0015   No results found for: HGBA1C No results found for: INSULIN CBC    Component Value Date/Time   WBC 5.6 05/24/2011 0015   RBC 4.54 05/24/2011 0015   HGB 12.7 05/24/2011 0015   HCT 40.0 05/24/2011 0015   PLT 305 05/24/2011 0015   MCV 88.1 05/24/2011 0015   MCH 28.0 05/24/2011 0015   MCHC 31.8 05/24/2011 0015   RDW 14.2 05/24/2011 0015   Iron/TIBC/Ferritin/ %Sat No results found for:  IRON, TIBC, FERRITIN, IRONPCTSAT Lipid Panel  No results found for: CHOL, TRIG, HDL, CHOLHDL, VLDL, LDLCALC, LDLDIRECT Hepatic Function Panel  No results found for: PROT, ALBUMIN, AST, ALT, ALKPHOS, BILITOT, BILIDIR, IBILI No results found for: TSH   Assessment and Plan   Morbid obesity (HCC), Starting BMI 67.8  Bilateral lower extremity edema  Healthcare maintenance   Assessment and Plan         ESTABLISH  WITH HWW     Obesity Treatment / Action Plan:  Patient will work on garnering support from family and friends to begin weight loss journey. Will work on eliminating or reducing the presence of highly palatable, calorie dense foods in the home. Will complete provided nutritional and psychosocial assessment questionnaire before the next appointment. Will be scheduled for indirect calorimetry to determine resting energy expenditure in a fasting state.  This will allow us  to create a reduced calorie, high-protein meal plan to promote loss of fat mass while preserving muscle mass. Counseled on the health benefits of losing 5%-15% of total body weight. Was counseled on nutritional approaches to weight loss and benefits of reducing processed foods and consuming plant-based foods and high quality protein as part of nutritional weight management. Was counseled on pharmacotherapy and role as an adjunct in weight management.   Obesity Education Performed Today:  She was weighed on the bioimpedance scale and results were discussed and documented in the synopsis.  We discussed obesity as a disease and the importance of a more detailed evaluation of all the factors contributing to the disease.  We discussed the importance of long term lifestyle changes which include nutrition, exercise and behavioral modifications as well as the importance of customizing this to her specific health and social needs.  We discussed the benefits of reaching a healthier weight to alleviate the symptoms of existing conditions and reduce the risks of the biomechanical, metabolic and psychological effects of obesity.  We reviewed the four pillars of obesity medicine and importance of using a multimodal approach.  We reviewed the basic principles in weight management.   Anne Fitzpatrick appears to be in the action stage of change and states they are ready to start intensive lifestyle modifications and behavioral  modifications.  I have spent 30 minutes in the care of the patient today including: 3 minutes before the visit reviewing and preparing the chart. 24 minutes face-to-face assessing and reviewing listed medical problems as outlined in obesity care plan, providing nutritional and behavioral counseling on topics outlined in the obesity care plan, counseling regarding anti-obesity medication as outlined in obesity care plan, independently interpreting test results and goals of care, as described in assessment and plan, and reviewing and discussing biometric information and progress 3 minutes after the visit updating chart and documentation of encounter.  Reviewed by clinician on day of visit: allergies, medications, problem list, medical history, surgical history, family history, social history, and previous encounter notes pertinent to obesity diagnosis.  Sharel Behne d. Alline Pio, NP-C

## 2024-04-26 NOTE — Progress Notes (Unsigned)
 [address] [place],  [place], [place] [address]  Office: [phone]  /  Fax: [phone]   Subjective   Initial Visit  Anne Fitzpatrick (MR# [id number]) is a 58 y.o. female who presents for evaluation and treatment of obesity and related comorbidities. Current BMI is There is no height or weight on file to calculate BMI. Anne Fitzpatrick has been struggling with her weight for many years and has been unsuccessful in either losing weight, maintaining weight loss, or reaching her healthy weight goal.  Anne Fitzpatrick is currently in the action stage of change and ready to dedicate time achieving and maintaining a healthier weight. Anne Fitzpatrick is interested in becoming our patient and working on intensive lifestyle modifications including (but not limited to) diet and exercise for weight loss.  Weight history:  When asked how their weight has affected their life and health, she states: Contributed to medical problems, Contributed to orthopedic problems or mobility issues, Having fatigue, Having poor endurance, Problems with eating patterns, and Has affected mood   When asked what else they would like to accomplish? She states: Adopt a healthier eating pattern and lifestyle, Improve energy levels and physical activity, Improve existing medical conditions, and Improve quality of life  She starting to note weight gain during : {emstartedtogainweight:31588}.  Life events associated with weight gain include : {emlifeeventsweighgain:31589}.   Other contributing factors: family history of obesity, disruption of circadian rhythm / sleep disordered breathing, consumption of processed foods, moderate to high levels of stress, reduced physical activity, chronic skipping of meals, menopause, sedentary job, hectic pace of life, and need for convenient foods.  Their highest weight has been:  395 lbs.  Desired weight: ***  Previous weight-loss programs : Weight Watchers, Nutrisystem, [facility], Low Carb, Ketogenic, Tracking and  Journaling, High Protein, and [facility].  Their maximum weight loss was:  *** lbs.  Their greatest challenge with dieting: {emgreatestchallengediet:31593}.  Current or previous pharmacotherapy: Phentermine and Other: diethylproprion.  Response to medication: Ineffective so it was discontinued and Had side effects so it was discontinued   Nutritional History:  Current nutrition plan: None.  How many times do you eat outside the home: {emfrequency:31645}  How often do they skip meals: {emskipmeals:31594::does not skip meals}  What beverages do they drink: {embeverages:31595}.   Use of artificial sweetners : {Yes/No:30480221}  Food intolerances or dislikes: {emfoodintolerance:31596::none}.  Food triggers: {emfoodtriggers:31600::None}.  Food cravings: {emfoodcravings:31601}  Do they struggle with excessive hunger or portion control : {YES/NO:21197}   Physical Activity:  Current level of physical activity: None  Barriers to Exercise: {embarrierstoexercise:32606::no barriers}   Past medical history includes:   Past Medical History:  Diagnosis Date   Asthma    Hypothyroid    Seasonal allergies      Objective   There were no vitals taken for this visit. She was weighed on the bioimpedance scale: There is no height or weight on file to calculate BMI.    Anthropometrics:  No data recorded No data recorded No data recorded No data recorded  Physical Exam:  General: She is overweight, cooperative, alert, well developed, and in no acute distress. PSYCH: Has normal mood, affect and thought process.   HEENT: EOMI, sclerae are anicteric. Lungs: Normal breathing effort, no conversational dyspnea. Extremities: No edema.  Neurologic: No gross sensory or motor deficits. No tremors or fasciculations noted.    Diagnostic Data Reviewed  EKG: Normal sinus rhythm, rate ***. No conduction abnormalities, abnormal Q waves or chamber  enlargement.  Indirect Calorimeter  completed today shows a VO2 of *** and a REE of ***.  Her calculated basal metabolic rate is *** thus her {zfprmzdlou:68397}.  Depression Screen  Anne Fitzpatrick was: ***.      No data to display          Screening for Sleep Related Breathing Disorders  [place] {Actions; denies/reports/admits to:19208} daytime somnolence and {Actions; denies/reports/admits to:19208} waking up still tired. Patient has a history of symptoms of {OSA Sx:17850}. Anne Fitzpatrick generally gets {numbers (fuzzy):14653} hours of sleep per night, and states that she has {sleep quality:17851}. Snoring {is/are not:32546} present. Apneic episodes {is/are not:32546} present. Epworth Sleepiness Fitzpatrick is ***.   BMET    Component Value Date/Time   NA 138 05/24/2011 0015   K 3.8 05/24/2011 0015   CL 104 05/24/2011 0015   CO2 25 05/24/2011 0015   GLUCOSE 94 05/24/2011 0015   BUN 15 05/24/2011 0015   CREATININE 0.75 05/24/2011 0015   CALCIUM 9.4 05/24/2011 0015   GFRNONAA >90 05/24/2011 0015   GFRAA >90 05/24/2011 0015   No results found for: HGBA1C No results found for: INSULIN CBC    Component Value Date/Time   WBC 5.6 05/24/2011 0015   RBC 4.54 05/24/2011 0015   HGB 12.7 05/24/2011 0015   HCT 40.0 05/24/2011 0015   PLT 305 05/24/2011 0015   MCV 88.1 05/24/2011 0015   MCH 28.0 05/24/2011 0015   MCHC 31.8 05/24/2011 0015   RDW 14.2 05/24/2011 0015   Iron/TIBC/Ferritin/ %Sat No results found for: IRON, TIBC, FERRITIN, IRONPCTSAT Lipid Panel  No results found for: CHOL, TRIG, HDL, CHOLHDL, VLDL, LDLCALC, LDLDIRECT Hepatic Function Panel  No results found for: PROT, ALBUMIN, AST, ALT, ALKPHOS, BILITOT, BILIDIR, IBILI No results found for: TSH   Assessment and Plan   TREATMENT PLAN FOR OBESITY:  Recommended Dietary Goals  Niko is currently in the action stage of change. As such, her goal is to implement medically  supervised obesity management plan.  She has agreed to implement: {emwtlossplannewint:31639}  Behavioral Intervention  We discussed the following Behavioral Modification Strategies today: {EMwtlossstrategiesnewint:31640::increasing lean protein intake to established goals,decreasing simple carbohydrates ,increasing vegetables,increasing lower glycemic fruits,increasing fiber rich foods,avoiding skipping meals,increasing water intake,work on meal planning and preparation,reading food labels ,keeping healthy foods at home,identifying sources and decreasing liquid calories,decreasing eating out or consumption of processed foods, and making healthy choices when eating convenient foods,planning for success,better snacking choices}  Additional resources provided today: {emadditionalresourcesnewint:32116::Handout on healthy eating and balanced plate,Handout on complex carbohydrates and lean sources of protein,Handout principles of weight management}  Recommended Physical Activity Goals  Meleena has been advised to work up to 150 minutes of moderate intensity aerobic activity a week and strengthening exercises 2-3 times per week for cardiovascular health, weight loss maintenance and preservation of muscle mass.   She has agreed to :  {EMEXERCISE:28847::Think about enjoyable ways to increase daily physical activity and overcoming barriers to exercise,Increase physical activity in their day and reduce sedentary time (increase NEAT).,Increase volume of physical activity to a goal of 240 minutes a week,Combine aerobic and strengthening exercises for efficiency and improved cardiometabolic health.}  Medical Interventions and Pharmacotherapy We will work on building a Therapist, art and behavioral strategies. We will discuss the role of pharmacotherapy as an adjunct at subsequent visits.   ASSOCIATED CONDITIONS ADDRESSED TODAY  Other Fatigue ***.  Seira does feel that her weight is causing her energy to be lower than it should be. Fatigue may be related to obesity, depression or  many other causes. Labs will be ordered, and in the meanwhile, Luv will focus on self care including making healthy food choices, increasing physical activity and focusing on stress reduction.  Shortness of Breath Jelene notes increasing shortness of breath with physical activity and seems to be worsening over time with weight gain. She notes getting out of breath sooner with activity than she used to. This has not gotten worse recently. Sugey denies shortness of breath at rest or orthopnea.  There are no diagnoses linked to this encounter.  Follow-up  She was informed of the importance of frequent follow-up visits to maximize her success with intensive lifestyle modifications for her multiple health conditions. She was informed we would discuss her lab results at her next visit unless there is a critical issue that needs to be addressed sooner. Rosea agreed to keep her next visit at the agreed upon time to discuss these results.  Attestation Statement  This is the patient's intake visit at [facility]. The patient's Health Questionnaire was reviewed at length. Included in the packet: current and past health history, medications, allergies, ROS, gynecologic history (women only), surgical history, family history, social history, weight history, weight loss surgery history (for those that have had weight loss surgery), nutritional evaluation, mood and food questionnaire, PHQ9, Epworth questionnaire, sleep habits questionnaire, patient life and health improvement goals questionnaire. These will all be scanned into the patient's chart under media.   During the visit, I independently reviewed the patient's EKG***, previous labs, bioimpedance scale results, and indirect calorimetry results. I used this information to medically tailor a meal plan for the  patient that will help her to lose weight and will improve her obesity-related conditions. I performed a medically necessary appropriate examination and/or evaluation. I discussed the assessment and treatment plan with the patient. The patient was provided an opportunity to ask questions and all were answered. The patient agreed with the plan and demonstrated an understanding of the instructions. Labs were ordered at this visit and will be reviewed at the next visit unless critical results need to be addressed immediately. Clinical information was updated and documented in the EMR.   In addition, they received basic education on identification of processed foods and reduction of these, different sources of lean proteins and complex carbohydrates and how to eat balanced by incorporation of whole foods.  Reviewed by clinician on day of visit: allergies, medications, problem list, medical history, surgical history, family history, social history, and previous encounter notes.  I have spent *** minutes in the care of the patient today including: {NUMBER 1-10:22536} minutes before the visit reviewing and preparing the chart. *** minutes face-to-face {emfacetoface:32598::assessing and reviewing listed medical problems as outlined in obesity care plan,providing nutritional and behavioral counseling on topics outlined in the obesity care plan,independently interpreting test results and goals of care, as described in assessment and plan,reviewing and discussing biometric information and progress} {NUMBER 1-10:22536} minutes after the visit updating chart and documentation of encounter.       Latasia Silberstein ANP-C

## 2024-04-27 ENCOUNTER — Encounter (INDEPENDENT_AMBULATORY_CARE_PROVIDER_SITE_OTHER): Payer: Self-pay | Admitting: Nurse Practitioner

## 2024-04-27 ENCOUNTER — Ambulatory Visit (INDEPENDENT_AMBULATORY_CARE_PROVIDER_SITE_OTHER): Admitting: Nurse Practitioner

## 2024-04-27 VITALS — BP 136/78 | HR 70 | Temp 98.7°F | Ht 64.0 in | Wt 395.0 lb

## 2024-04-27 DIAGNOSIS — R600 Localized edema: Secondary | ICD-10-CM

## 2024-04-27 DIAGNOSIS — E785 Hyperlipidemia, unspecified: Secondary | ICD-10-CM

## 2024-04-27 DIAGNOSIS — E1169 Type 2 diabetes mellitus with other specified complication: Secondary | ICD-10-CM

## 2024-04-27 DIAGNOSIS — Z1331 Encounter for screening for depression: Secondary | ICD-10-CM | POA: Diagnosis not present

## 2024-04-27 DIAGNOSIS — R5383 Other fatigue: Secondary | ICD-10-CM

## 2024-04-27 DIAGNOSIS — E039 Hypothyroidism, unspecified: Secondary | ICD-10-CM

## 2024-04-27 DIAGNOSIS — R0602 Shortness of breath: Secondary | ICD-10-CM | POA: Diagnosis not present

## 2024-04-27 DIAGNOSIS — E1165 Type 2 diabetes mellitus with hyperglycemia: Secondary | ICD-10-CM | POA: Diagnosis not present

## 2024-04-27 DIAGNOSIS — J4520 Mild intermittent asthma, uncomplicated: Secondary | ICD-10-CM

## 2024-04-27 DIAGNOSIS — Z6841 Body Mass Index (BMI) 40.0 and over, adult: Secondary | ICD-10-CM

## 2024-04-27 DIAGNOSIS — E66813 Obesity, class 3: Secondary | ICD-10-CM

## 2024-04-27 DIAGNOSIS — E559 Vitamin D deficiency, unspecified: Secondary | ICD-10-CM

## 2024-04-27 NOTE — Addendum Note (Signed)
 Addended by: JUDE BRUNET E on: 04/27/2024 11:37 AM   Modules accepted: Level of Service

## 2024-04-29 ENCOUNTER — Ambulatory Visit (INDEPENDENT_AMBULATORY_CARE_PROVIDER_SITE_OTHER): Payer: Self-pay | Admitting: Nurse Practitioner

## 2024-04-29 LAB — COMPREHENSIVE METABOLIC PANEL WITH GFR
ALT: 14 IU/L (ref 0–32)
AST: 14 IU/L (ref 0–40)
Albumin: 3.8 g/dL (ref 3.8–4.9)
Alkaline Phosphatase: 126 IU/L (ref 49–135)
BUN/Creatinine Ratio: 13 (ref 9–23)
BUN: 9 mg/dL (ref 6–24)
Bilirubin Total: 0.3 mg/dL (ref 0.0–1.2)
CO2: 25 mmol/L (ref 20–29)
Calcium: 9.1 mg/dL (ref 8.7–10.2)
Chloride: 106 mmol/L (ref 96–106)
Creatinine, Ser: 0.71 mg/dL (ref 0.57–1.00)
Globulin, Total: 2.6 g/dL (ref 1.5–4.5)
Glucose: 87 mg/dL (ref 70–99)
Potassium: 4.2 mmol/L (ref 3.5–5.2)
Sodium: 142 mmol/L (ref 134–144)
Total Protein: 6.4 g/dL (ref 6.0–8.5)
eGFR: 100 mL/min/1.73 (ref >59)

## 2024-04-29 LAB — CBC WITH DIFFERENTIAL/PLATELET
Basophils Absolute: 0 x10E3/uL (ref 0.0–0.2)
Basos: 1 %
EOS (ABSOLUTE): 0.5 x10E3/uL — ABNORMAL HIGH (ref 0.0–0.4)
Eos: 11 %
Hematocrit: 37.6 % (ref 34.0–46.6)
Hemoglobin: 11.9 g/dL (ref 11.1–15.9)
Immature Grans (Abs): 0 x10E3/uL (ref 0.0–0.1)
Immature Granulocytes: 0 %
Lymphocytes Absolute: 1.3 x10E3/uL (ref 0.7–3.1)
Lymphs: 32 %
MCH: 29.1 pg (ref 26.6–33.0)
MCHC: 31.6 g/dL (ref 31.5–35.7)
MCV: 92 fL (ref 79–97)
Monocytes Absolute: 0.4 x10E3/uL (ref 0.1–0.9)
Monocytes: 9 %
Neutrophils Absolute: 1.9 x10E3/uL (ref 1.4–7.0)
Neutrophils: 47 %
Platelets: 243 x10E3/uL (ref 150–450)
RBC: 4.09 x10E6/uL (ref 3.77–5.28)
RDW: 12.9 % (ref 11.7–15.4)
WBC: 4.2 x10E3/uL (ref 3.4–10.8)

## 2024-04-29 LAB — VITAMIN D 25 HYDROXY (VIT D DEFICIENCY, FRACTURES): Vit D, 25-Hydroxy: 22.4 ng/mL — ABNORMAL LOW (ref 30.0–100.0)

## 2024-04-29 LAB — VITAMIN B12: Vitamin B-12: 273 pg/mL (ref 232–1245)

## 2024-04-29 LAB — TSH: TSH: 2.76 u[IU]/mL (ref 0.450–4.500)

## 2024-04-29 LAB — INSULIN, RANDOM: INSULIN: 26.1 u[IU]/mL — AB (ref 2.6–24.9)

## 2024-05-11 ENCOUNTER — Ambulatory Visit (INDEPENDENT_AMBULATORY_CARE_PROVIDER_SITE_OTHER): Admitting: Nurse Practitioner

## 2024-05-11 ENCOUNTER — Encounter (INDEPENDENT_AMBULATORY_CARE_PROVIDER_SITE_OTHER): Payer: Self-pay | Admitting: Nurse Practitioner

## 2024-05-11 VITALS — BP 146/98 | HR 85 | Temp 98.1°F | Ht 64.0 in | Wt 381.0 lb

## 2024-05-11 DIAGNOSIS — E88819 Insulin resistance, unspecified: Secondary | ICD-10-CM

## 2024-05-11 DIAGNOSIS — E1165 Type 2 diabetes mellitus with hyperglycemia: Secondary | ICD-10-CM | POA: Diagnosis not present

## 2024-05-11 DIAGNOSIS — E559 Vitamin D deficiency, unspecified: Secondary | ICD-10-CM

## 2024-05-11 DIAGNOSIS — Z7984 Long term (current) use of oral hypoglycemic drugs: Secondary | ICD-10-CM

## 2024-05-11 DIAGNOSIS — E785 Hyperlipidemia, unspecified: Secondary | ICD-10-CM

## 2024-05-11 DIAGNOSIS — R030 Elevated blood-pressure reading, without diagnosis of hypertension: Secondary | ICD-10-CM

## 2024-05-11 DIAGNOSIS — E1169 Type 2 diabetes mellitus with other specified complication: Secondary | ICD-10-CM

## 2024-05-11 DIAGNOSIS — Z6841 Body Mass Index (BMI) 40.0 and over, adult: Secondary | ICD-10-CM

## 2024-05-11 DIAGNOSIS — E66813 Obesity, class 3: Secondary | ICD-10-CM

## 2024-05-11 NOTE — Progress Notes (Signed)
 Office: [phone]  /  Fax: [phone]  WEIGHT SUMMARY AND BIOMETRICS  Weight Lost Since Last Visit: 14 lb  Weight Gained Since Last Visit: 0   Vitals Temp: 98.1 F (36.7 C) BP: (!) 146/98 Pulse Rate: 85 SpO2: 95 %   Anthropometric Measurements Height: 5' 4 (1.626 m) Weight: (!) 381 lb (172.8 kg) BMI (Calculated): 65.37 Weight at Last Visit: 395 lb Weight Lost Since Last Visit: 14 lb Weight Gained Since Last Visit: 0 Starting Weight: 395 lb Total Weight Loss (lbs): 14 lb (6.35 kg) Peak Weight: 395 lb Waist Measurement : 60 inches   Body Composition  Body Fat %: 65.1 % Fat Mass (lbs): 248.4 lbs Muscle Mass (lbs): 126.4 lbs Visceral Fat Rating : 32   Other Clinical Data RMR: 1971 Fasting: no Labs: no Today's Visit #: 2 Starting Date: 04/27/24    Total Weight Loss: 14 pounds Percent of body weight lost: 3.5%  Bio Impedance Data reviewed with patient: Up 4 pounds of muscle and down 18.2 pounds of adipose  HPI  Chief Complaint: OBESITY  Anne Fitzpatrick is here to discuss her progress with her obesity treatment plan. She is on the the Category 3 Plan and states she is following her eating plan approximately 90 % of the time. She states she has not started exercise, has been focusing on her nutrition plan.    Interval History:  Since last office visit she has been following her plan 90 % of the time. She has been noticing she does have a full sensation when eating her protein requirements and has not been having cravings. She has not been skipping meals. She is doing at last 64 ounces a day of water. Dinner has been the hardest due to more prep required. She has had some improvement in her shortness of breath with loss of weight. She plans to start exercise by doing chair exercise once a week.   Blood pressure is elevated without diagnosis of hypertension. Initial BP was 132/96 and recheck was 146/98. She does not check BP at home.  Has been running 130's over  70's at her PCP.  BP Readings from Last 3 Encounters:  05/11/24 (!) 146/98  04/27/24 136/78  03/24/24 (!) 183/85     Lab results are reviewed with patient in detail.   She had previous recent A1c and Lipid panel at her PCP Dr Maree- was to bring results but forgot, will bring to next appointment- has diabetes type 2 and hyperlipidemia which are not currently being treated with medication.  Liver functions, kidney functions, glucose, electrolytes, Vit B12, blood count, thyroid  are all normal.  Fasting insulin is elevated at 26.1 and HOMA-IR score of 5.60 indicating insulin resistance. History of Vitamin D deficiency but is not currently on supplementation- Vit D level is low and requires supplementation. She previously tried ergocalciferol 50000 units and was unable to raise Vit D but had success with doing Vit D3  5000 units twice a day.   She continues on Breztri as needed(ordered 2 puffs BID) and singulair 10 mg daily. Has albuterol inhaler to use as needed. Asthma has been well controlled on current regimen.    Cancer Screenings: Reviewed with patient as obesity is risk factor for certain cancers She is BRCA1 positive and has had total hysterectomy with BSO and gets regular mammograms Pap: post hysterectomy Mammogram: 07/28/23- negative, repeat due in 1 year Colonoscopy: 03/02/20 follow up 10 years No history of smoking   PHYSICAL EXAM:  Blood pressure (!) 146/98,  pulse 85, temperature 98.1 F (36.7 C), height 5' 4 (1.626 m), weight (!) 381 lb (172.8 kg), SpO2 95%. Body mass index is 65.4 kg/m.  General: Well Developed, well nourished, and in no acute distress.  HEENT: Normocephalic, atraumatic; EOMI, sclerae are anicteric. Skin: Warm and dry, good turgor Chest:  Normal excursion, shape, no gross ABN Respiratory: No conversational dyspnea; speaking in full sentences NeuroM-Sk:  Normal gross ROM * 4 extremities  Psych: A and O X 3, insight adequate, mood- full    DIAGNOSTIC DATA  REVIEWED:  Last metabolic panel Lab Results  Component Value Date   GLUCOSE 87 04/27/2024   NA 142 04/27/2024   K 4.2 04/27/2024   CL 106 04/27/2024   CO2 25 04/27/2024   BUN 9 04/27/2024   CREATININE 0.71 04/27/2024   EGFR 100 04/27/2024   CALCIUM 9.1 04/27/2024   PROT 6.4 04/27/2024   ALBUMIN 3.8 04/27/2024   LABGLOB 2.6 04/27/2024   BILITOT 0.3 04/27/2024   ALKPHOS 126 04/27/2024   AST 14 04/27/2024   ALT 14 04/27/2024     No results found for: HGBA1C Lab Results  Component Value Date   INSULIN 26.1 (H) 04/27/2024   Lab Results  Component Value Date   TSH 2.760 04/27/2024   CBC Lab Results  Component Value Date   WBC 4.2 04/27/2024   HGB 11.9 04/27/2024   HCT 37.6 04/27/2024   MCV 92 04/27/2024   PLT 243 04/27/2024     Nutritional Lab Results  Component Value Date   VD25OH 22.4 (L) 04/27/2024   Lab Results  Component Value Date   VITAMINB12 273 04/27/2024     ASSESSMENT AND PLAN  TREATMENT PLAN FOR OBESITY:  Recommended Dietary Goals  Anne Fitzpatrick is currently in the action stage of change. As such, her goal is to continue weight management plan. She has agreed to the Category 3 Plan.  Behavioral Intervention  We discussed the following Behavioral Modification Strategies today: continue to work on maintaining a reduced calorie state, getting the recommended amount of protein, incorporating whole foods, making healthy choices, staying well hydrated and practicing mindfulness when eating. and increase protein intake, fibrous foods (25 grams per day for women, 30 grams for men) and water to improve satiety and decrease hunger signals. .Continue working of getting 80-100 grams of protein daily and at least 64 ounces of water daily.    Recommended Physical Activity Goals  Anne Fitzpatrick has been advised to work up to 150 minutes of moderate intensity aerobic activity a week and strengthening exercises 2-3 times per week for cardiovascular health, weight loss  maintenance and preservation of muscle mass.   She has agreed to Patient will begin to exercise by doing chair exercises at least once a week to start, Think about enjoyable ways to increase daily physical activity and overcoming barriers to exercise, and Increase physical activity in their day and reduce sedentary time (increase NEAT).   Pharmacotherapy We discussed various medication options to help Anne Fitzpatrick with her weight loss efforts and we both agreed to continue nutrition and exercise.  ASSOCIATED CONDITIONS ADDRESSED TODAY  Diagnoses and all orders for this visit:  Type 2 diabetes mellitus with hyperglycemia, without long-term current use of insulin (HCC) Insulin resistance Continue Category 3  meal plan, limit simple carbohydrates Decreasing body weight by 10-15% can improve glucose levels Start exercise with chair exercise at least once a week- reviewed videos online Discussed use of Metformin but she does not want to use medication currently.  If weight loss slows will consider addition of Metformin for insulin resistance  Type 2 diabetes mellitus with hyperlipidemia (HCC) Continue category 3 meal plan, limit saturated fats Loss of 10-15% body weight can improve lipid levels Start exercise with chair exercise at least once a week- reviewed videos online  Vitamin D deficiency       Start Vit D3 5000 units twice a day to help raise Vit D level- will recheck level in 12 weeks       Used Ergocalciferol in the past and did not increase her Vit D level but using Vit D3 BID did increase her level in the past.   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  First activity is a nature walk on the 28th of October.       See plan above          Return in about 2 weeks (around 05/25/2024).Anne Fitzpatrick She was informed of the importance of frequent follow up visits to maximize her success with intensive lifestyle modifications for her multiple health conditions.   ATTESTASTION STATEMENTS:  Reviewed by clinician on day of visit: allergies, medications, problem list, medical history, surgical history, family history, social history, and previous encounter notes.   I personally spent a total of 63 minutes in the care of the patient today including preparing to see the patient, performing a medically appropriate exam/evaluation, counseling and educating, documenting clinical information in the EHR, independently interpreting results, communicating results, and coordinating care.   Kerisha Goughnour ANP-C

## 2024-05-26 ENCOUNTER — Ambulatory Visit (INDEPENDENT_AMBULATORY_CARE_PROVIDER_SITE_OTHER): Admitting: Nurse Practitioner

## 2024-06-03 ENCOUNTER — Ambulatory Visit (INDEPENDENT_AMBULATORY_CARE_PROVIDER_SITE_OTHER): Payer: Self-pay | Admitting: Nurse Practitioner

## 2024-06-03 ENCOUNTER — Encounter (INDEPENDENT_AMBULATORY_CARE_PROVIDER_SITE_OTHER): Payer: Self-pay | Admitting: Nurse Practitioner

## 2024-06-03 VITALS — BP 135/79 | HR 70 | Temp 98.5°F | Ht 64.0 in | Wt 381.0 lb

## 2024-06-03 DIAGNOSIS — E559 Vitamin D deficiency, unspecified: Secondary | ICD-10-CM | POA: Diagnosis not present

## 2024-06-03 DIAGNOSIS — Z6841 Body Mass Index (BMI) 40.0 and over, adult: Secondary | ICD-10-CM

## 2024-06-03 DIAGNOSIS — E1165 Type 2 diabetes mellitus with hyperglycemia: Secondary | ICD-10-CM

## 2024-06-03 DIAGNOSIS — E88819 Insulin resistance, unspecified: Secondary | ICD-10-CM

## 2024-06-03 DIAGNOSIS — E66813 Obesity, class 3: Secondary | ICD-10-CM

## 2024-06-03 DIAGNOSIS — E538 Deficiency of other specified B group vitamins: Secondary | ICD-10-CM

## 2024-06-03 DIAGNOSIS — E7800 Pure hypercholesterolemia, unspecified: Secondary | ICD-10-CM

## 2024-06-03 MED ORDER — VITAMIN B-12 1000 MCG PO TABS: 1000.0000 ug | ORAL_TABLET | Freq: Every day | ORAL | 0 refills | Status: AC

## 2024-06-03 NOTE — Progress Notes (Signed)
 Office: [phone]  /  Fax: [phone]  WEIGHT SUMMARY AND BIOMETRICS  Weight Lost Since Last Visit: 0  Weight Gained Since Last Visit: 0   Vitals Temp: 98.5 F (36.9 C) BP: 135/79 Pulse Rate: 70 SpO2: 97 %   Anthropometric Measurements Height: 5' 4 (1.626 m) Weight: (!) 381 lb (172.8 kg) BMI (Calculated): 65.37 Weight at Last Visit: 381 lb Weight Lost Since Last Visit: 0 Weight Gained Since Last Visit: 0 Starting Weight: 395 lb Total Weight Loss (lbs): 14 lb (6.35 kg) Peak Weight: 395 lb Waist Measurement : 60 inches   Body Composition  Body Fat %: 66.3 % Fat Mass (lbs): 252.8 lbs Muscle Mass (lbs): 122.2 lbs Visceral Fat Rating : 32   Other Clinical Data Fasting: no Labs: no Today's Visit #: 3 Starting Date: 04/27/24    Total Weight Loss: 14 pounds Percent of body weight lost: 3.5%   Bio Impedance Data reviewed with patient:Muscle is down 4.2 pounds, adipose is up 4.4 pounds.  PBF increased from 65.1% to 66.3%. Visceral fat rating stayed the same at 32.  HPI  Chief Complaint: OBESITY  Anne Fitzpatrick is here to discuss her progress with her obesity treatment plan. She is on the the Category 3 Plan and states she is following her eating plan approximately 50 % of the time. She states she is exercising with chair aerobics 30 minutes 2 days per week.   Interval History:  Since last office visit she has had a bad 2 weeks because her mom got sick and did not focus on herself. She is doing better now. She has been doing her protein shake for breakfast. But has not been getting enough protein during the day and has started feeling hungrier during the day.  She is ready to restart and focus on herself.  Focus on getting 100-120 grams of protein divided in at least 3 large servings of protein.   She does amazing things for all her children at work and her family but has been putting herself last. She did have her nature walk at work and it was amazing 37 out of 66  parents came to the walk. 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 December is skate night-moving well staying strong.  Thanksgiving is at her house. Everyone is bringing what they want to have and she is cooking the turkey    PHYSICAL EXAM:  Blood pressure 135/79, pulse 70, temperature 98.5 F (36.9 C), height 5' 4 (1.626 m), weight (!) 381 lb (172.8 kg), SpO2 97%. Body mass index is 65.4 kg/m.  General: Well Developed, well nourished, and in no acute distress.  HEENT: Normocephalic, atraumatic; EOMI, sclerae are anicteric. Skin: Warm and dry, good turgor Chest:  Normal excursion, shape, no gross ABN Respiratory: No conversational dyspnea; speaking in full sentences NeuroM-Sk:  Normal gross ROM * 4 extremities  Psych: A and O X 3, insight adequate, mood- full    DIAGNOSTIC DATA REVIEWED:  BMET    Component Value Date/Time   NA 142 04/27/2024 1050   K 4.2 04/27/2024 1050   CL 106 04/27/2024 1050   CO2 25 04/27/2024 1050   GLUCOSE 87 04/27/2024 1050   GLUCOSE 94 05/24/2011 0015   BUN 9 04/27/2024 1050   CREATININE 0.71 04/27/2024 1050   CALCIUM 9.1 04/27/2024 1050   GFRNONAA >90 05/24/2011 0015   GFRAA >90 05/24/2011 0015   No results found for: HGBA1C Lab Results  Component Value Date  INSULIN 26.1 (H) 04/27/2024   Lab Results  Component Value Date   TSH 2.760 04/27/2024   CBC    Component Value Date/Time   WBC 4.2 04/27/2024 1050   WBC 5.6 05/24/2011 0015   RBC 4.09 04/27/2024 1050   RBC 4.54 05/24/2011 0015   HGB 11.9 04/27/2024 1050   HCT 37.6 04/27/2024 1050   PLT 243 04/27/2024 1050   MCV 92 04/27/2024 1050   MCH 29.1 04/27/2024 1050   MCH 28.0 05/24/2011 0015   MCHC 31.6 04/27/2024 1050   MCHC 31.8 05/24/2011 0015   RDW 12.9 04/27/2024 1050   Iron Studies No results found for: IRON, TIBC, FERRITIN, IRONPCTSAT Lipid Panel  No results found for: CHOL, TRIG, HDL, CHOLHDL, VLDL, LDLCALC,  LDLDIRECT Hepatic Function Panel     Component Value Date/Time   PROT 6.4 04/27/2024 1050   ALBUMIN 3.8 04/27/2024 1050   AST 14 04/27/2024 1050   ALT 14 04/27/2024 1050   ALKPHOS 126 04/27/2024 1050   BILITOT 0.3 04/27/2024 1050      Component Value Date/Time   TSH 2.760 04/27/2024 1050   Nutritional Lab Results  Component Value Date   VD25OH 22.4 (L) 04/27/2024     ASSESSMENT AND PLAN  Class 3 severe obesity with serious comorbidity and body mass index (BMI) of 60.0 to 69.9 in adult, unspecified obesity type (HCC) TREATMENT PLAN FOR OBESITY:  Recommended Dietary Goals  Anne Fitzpatrick is currently in the action stage of change. As such, her goal is to continue weight management plan. She has agreed to the Category 3 Plan.  Behavioral Intervention  We discussed the following Behavioral Modification Strategies today: increasing lean protein intake to established goals, increasing vegetables, increasing fiber rich foods, avoiding skipping meals, increasing water intake , work on meal planning and preparation, practice mindfulness eating and understand the difference between hunger signals and cravings, work on managing stress, creating time for self-care and relaxation, continue to work on implementation of reduced calorie nutritional plan, celebration eating strategies, and getting back on track after recent relapse.   Recommended Physical Activity Goals  Anne Fitzpatrick has been advised to work up to 150 minutes of moderate intensity aerobic activity a week and strengthening exercises 2-3 times per week for cardiovascular health, weight loss maintenance and preservation of muscle mass.   She has agreed to Patient will begin to exercise chair exercises 2 times a week- given youtube video [employer] that has lots of good sitting workouts., Think about enjoyable ways to increase daily physical activity and overcoming barriers to exercise, and Increase physical activity in their day and reduce  sedentary time (increase NEAT).   Pharmacotherapy We discussed various medication options to help Anne Fitzpatrick with her weight loss efforts and we both agreed to continue nutrition and behavior modification.  ASSOCIATED CONDITIONS ADDRESSED TODAY  Action/Plan  B12 deficiency Start Vit B12 supplement every other day -     Vitamin B-12; Take 1 tablet (1,000 mcg total) by mouth daily.  Dispense: 90 tablet; Refill: 0  Type 2 diabetes mellitus with hyperglycemia, without long-term current use of insulin (HCC) Restart Category 3  meal plan, limit simple carbohydrates, avoid skipping meals Decreasing body weight by 10-15% can improve glucose levels Work on trying to get 2 chair exercise workouts /week  Pure Hypercholesterolemia (HCC) Focus on implementing category 3 meal plan, limit saturated fats, increase lean protein and high fiber foods Loss of 10-15% body weight can improve lipid levels Work on trying to get 2 chair exercise workouts /  week  Vitamin D deficiency       Continue Vit D3 5000 units bid as this did help raise her Vit D previously whereas ergocalciferol 50000 units once a week did not increase levels.        Vitamin d supplementation has been shown to decrease fatigue, decrease risk of progression to insulin resistance and then prediabetes, decreases risk of falling in older age and can even assist in decreasing depressive symptoms in PTSD           Return in about 4 weeks (around 07/01/2024).Anne Fitzpatrick She was informed of the importance of frequent follow up visits to maximize her success with intensive lifestyle modifications for her multiple health conditions.   ATTESTASTION STATEMENTS:  Reviewed by clinician on day of visit: allergies, medications, problem list, medical history, surgical history, family history, social history, and previous encounter notes.   I personally spent a total of 51 minutes in the care of the patient today including preparing to see the patient,  getting/reviewing separately obtained history, performing a medically appropriate exam/evaluation, counseling and educating, placing orders, and documenting clinical information in the EHR.   Fleda Pagel ANP-C

## 2024-07-01 ENCOUNTER — Other Ambulatory Visit (INDEPENDENT_AMBULATORY_CARE_PROVIDER_SITE_OTHER): Payer: Self-pay | Admitting: Nurse Practitioner

## 2024-07-01 ENCOUNTER — Ambulatory Visit (INDEPENDENT_AMBULATORY_CARE_PROVIDER_SITE_OTHER): Admitting: Nurse Practitioner

## 2024-07-01 ENCOUNTER — Encounter (INDEPENDENT_AMBULATORY_CARE_PROVIDER_SITE_OTHER): Payer: Self-pay | Admitting: Nurse Practitioner

## 2024-07-01 VITALS — BP 138/79 | HR 66 | Temp 97.9°F | Ht 64.0 in | Wt 380.0 lb

## 2024-07-01 DIAGNOSIS — E1165 Type 2 diabetes mellitus with hyperglycemia: Secondary | ICD-10-CM | POA: Diagnosis not present

## 2024-07-01 DIAGNOSIS — E1169 Type 2 diabetes mellitus with other specified complication: Secondary | ICD-10-CM

## 2024-07-01 DIAGNOSIS — R609 Edema, unspecified: Secondary | ICD-10-CM

## 2024-07-01 DIAGNOSIS — R600 Localized edema: Secondary | ICD-10-CM

## 2024-07-01 DIAGNOSIS — E785 Hyperlipidemia, unspecified: Secondary | ICD-10-CM | POA: Diagnosis not present

## 2024-07-01 DIAGNOSIS — E66813 Obesity, class 3: Secondary | ICD-10-CM

## 2024-07-01 DIAGNOSIS — Z6841 Body Mass Index (BMI) 40.0 and over, adult: Secondary | ICD-10-CM

## 2024-07-01 MED ORDER — FUROSEMIDE 20 MG PO TABS: ORAL_TABLET | ORAL | 0 refills | Status: AC

## 2024-07-01 NOTE — Progress Notes (Signed)
 Office: [phone]  /  Fax: [phone]  WEIGHT SUMMARY AND BIOMETRICS  Weight Lost Since Last Visit: 1 lb  Weight Gained Since Last Visit: 0   Vitals Temp: 97.9 F (36.6 C) BP: 138/79 Pulse Rate: 66 SpO2: 96 %   Anthropometric Measurements Height: 5' 4 (1.626 m) Weight: (!) 380 lb (172.4 kg) BMI (Calculated): 65.19 Weight at Last Visit: 381 lb Weight Lost Since Last Visit: 1 lb Weight Gained Since Last Visit: 0 Starting Weight: 395 lb Total Weight Loss (lbs): 15 lb (6.804 kg) Peak Weight: 395 lb Waist Measurement : 60 inches   Body Composition  Body Fat %: 66 % Fat Mass (lbs): 251.4 lbs Muscle Mass (lbs): 122.8 lbs Visceral Fat Rating : 32   Other Clinical Data Fasting: no Labs: no Today's Visit #: 4 Starting Date: 04/27/24    Total Weight Loss:15 pounds  Percent of body weight lost:3.8 %  Bio Impedance Data reviewed with patient: Muscle is up 0.6 pounds and adipose is down 1.4 pounds.   HPI  Chief Complaint: OBESITY  Anne Fitzpatrick is here to discuss her progress with her obesity treatment plan. She is on the the Category 3 Plan and states she is following her eating plan approximately 75 % of the time. She states she is currently not exercising due to knee pain   Interval History:  Since last office visit she did go to her PCP because of selling in lower legs. She is being referred to the lipedema clinic by her PCP.  She is going to start PT for her right knee tomorrow. . She does plan to join [facility] in [place].  She has not been getting enough protein daily. Has been more depressed and did have bronchitis. She does not want to pursue Buproprion currently as she feels her mood may be improving but if not marked difference at next visit will consider.  She is trying to get at least 64 ounces of water.  Thanksgiving went well but too much food and lots of leftovers frozen.  She has not been tempted to eat them. Family and pt plan to have a healthy  Christmas dinner- she does wish to continue to lose weight over the holiday  She continues cholecalciferol 5000 units twice a day and will plan to recheck lab at next visit.  She does have Type 2 DM with hyperlipidemia and has been working on nutrition, exercise and weight loss for control. She may consider Mounjaro in the future.  PHYSICAL EXAM:  Blood pressure 138/79, pulse 66, temperature 97.9 F (36.6 C), height 5' 4 (1.626 m), weight (!) 380 lb (172.4 kg), SpO2 96%. Body mass index is 65.23 kg/m.  General: Well Developed, well nourished, and in no acute distress.  HEENT: Normocephalic, atraumatic; EOMI, sclerae are anicteric. Skin: Warm and dry, good turgor Chest:  Normal excursion, shape, no gross ABN Respiratory: No conversational dyspnea; speaking in full sentences NeuroM-Sk:  Normal gross ROM * 4 extremities  Psych: A and O X 3, insight adequate, mood- full    DIAGNOSTIC DATA REVIEWED:  BMET    Component Value Date/Time   NA 142 04/27/2024 1050   K 4.2 04/27/2024 1050   CL 106 04/27/2024 1050   CO2 25 04/27/2024 1050   GLUCOSE 87 04/27/2024 1050   GLUCOSE 94 05/24/2011 0015   BUN 9 04/27/2024 1050   CREATININE 0.71 04/27/2024 1050   CALCIUM 9.1 04/27/2024 1050   GFRNONAA >90 05/24/2011 0015   GFRAA >90 05/24/2011 0015  No results found for: HGBA1C Lab Results  Component Value Date   INSULIN  26.1 (H) 04/27/2024   Lab Results  Component Value Date   TSH 2.760 04/27/2024   CBC    Component Value Date/Time   WBC 4.2 04/27/2024 1050   WBC 5.6 05/24/2011 0015   RBC 4.09 04/27/2024 1050   RBC 4.54 05/24/2011 0015   HGB 11.9 04/27/2024 1050   HCT 37.6 04/27/2024 1050   PLT 243 04/27/2024 1050   MCV 92 04/27/2024 1050   MCH 29.1 04/27/2024 1050   MCH 28.0 05/24/2011 0015   MCHC 31.6 04/27/2024 1050   MCHC 31.8 05/24/2011 0015   RDW 12.9 04/27/2024 1050   Iron Studies No results found for: IRON, TIBC, FERRITIN, IRONPCTSAT Lipid Panel  No  results found for: CHOL, TRIG, HDL, CHOLHDL, VLDL, LDLCALC, LDLDIRECT Hepatic Function Panel     Component Value Date/Time   PROT 6.4 04/27/2024 1050   ALBUMIN 3.8 04/27/2024 1050   AST 14 04/27/2024 1050   ALT 14 04/27/2024 1050   ALKPHOS 126 04/27/2024 1050   BILITOT 0.3 04/27/2024 1050      Component Value Date/Time   TSH 2.760 04/27/2024 1050   Nutritional Lab Results  Component Value Date   VD25OH 22.4 (L) 04/27/2024     ASSESSMENT AND PLAN   Type 2 diabetes mellitus associated with morbid obesity (HCC)  Class 3 severe obesity with serious comorbidity and body mass index (BMI) of 60.0 to 69.9 in adult, unspecified obesity type (HCC)  TREATMENT PLAN FOR OBESITY:  Recommended Dietary Goals  Anne Fitzpatrick is currently in the action stage of change. As such, her goal is to continue weight management plan. She has agreed to the Category 3 Plan.  Behavioral Intervention  We discussed the following Behavioral Modification Strategies today: increasing lean protein intake to established goals, decreasing simple carbohydrates , increasing vegetables, increasing lower glycemic fruits, increasing fiber rich foods, avoiding skipping meals, increasing water intake , practice mindfulness eating and understand the difference between hunger signals and cravings, work on managing stress, creating time for self-care and relaxation, continue to work on implementation of reduced calorie nutritional plan, celebration eating strategies, increase protein intake, fibrous foods (25 grams per day for women, 30 grams for men) and water to improve satiety and decrease hunger signals. , and getting back on track after recent relapse.  She wants to lose weight in December - She does  recognize that she must follow a structured plan and will eat before social situation to meet her protein goals and minimze party foods - She will give away food gifts unless they are very low calories or high  protein - She will avoid calorie-containing liquids such as holiday drinks, eggnog and alcohol -She will stick to her structured plan strictly most of the time - This strategy should help her lose 1-2 pounds in December   Recommended Physical Activity Goals  Anne Fitzpatrick has been advised to work up to 150 minutes of moderate intensity aerobic activity a week and strengthening exercises 2-3 times per week for cardiovascular health, weight loss maintenance and preservation of muscle mass.   She has agreed to Patient will begin to exercise plans to join [facility]- advised water aerobics are best exercise with lipedema.    Pharmacotherapy We discussed various medication options to help Anne Fitzpatrick with her weight loss efforts and we both agreed to continue nutrition and behavior modification.  ASSOCIATED CONDITIONS ADDRESSED TODAY  Action/Plan  Type 2 diabetes mellitus with hyperglycemia, without  long-term current use of insulin  (HCC) Continue Category 3  meal plan, limit simple carbohydrates, increase lean protein  Decreasing body weight by 10-15% can improve glucose levels Start exercise- plans to join [facility]- stressed importance of self care   Type 2 diabetes mellitus with hyperlipidemia (HCC) Focus on implementing category 3 meal plan, limit saturated fats Loss of 10-15% body weight can improve lipid levels Start exercise- plans to join [facility]- stressed importance of self care   Localized edema She does currently have pitting edema of 1-2+ Will start Lasix 20 mg every other day, monitor for leg cramps and will recheck CMP at next visit Stressed importance of Full leg compression stockings -     Furosemide; Take 1 tab every other day  Dispense: 30 tablet; Refill: 0  Lipedema       Is being referred to lipedema clinic by PCP       Discussed importance of compression garments use as much as possible, avoid heavy weight lifting, water exercises are best for lipedema.                 Return in about 3 weeks (around 07/22/2024).SABRA She was informed of the importance of frequent follow up visits to maximize her success with intensive lifestyle modifications for her multiple health conditions.   ATTESTASTION STATEMENTS:  Reviewed by clinician on day of visit: allergies, medications, problem list, medical history, surgical history, family history, social history, and previous encounter notes.     Anne Fitzpatrick ANP-C

## 2024-07-02 ENCOUNTER — Ambulatory Visit: Admitting: Physical Therapy

## 2024-07-14 ENCOUNTER — Encounter: Payer: Self-pay | Admitting: Physical Therapy

## 2024-07-14 ENCOUNTER — Ambulatory Visit: Admitting: Physical Therapy

## 2024-07-14 ENCOUNTER — Other Ambulatory Visit: Payer: Self-pay

## 2024-07-14 DIAGNOSIS — M6281 Muscle weakness (generalized): Secondary | ICD-10-CM | POA: Insufficient documentation

## 2024-07-14 DIAGNOSIS — M25661 Stiffness of right knee, not elsewhere classified: Secondary | ICD-10-CM | POA: Insufficient documentation

## 2024-07-14 DIAGNOSIS — R2689 Other abnormalities of gait and mobility: Secondary | ICD-10-CM | POA: Diagnosis present

## 2024-07-14 DIAGNOSIS — M25561 Pain in right knee: Secondary | ICD-10-CM | POA: Diagnosis present

## 2024-07-14 DIAGNOSIS — G8929 Other chronic pain: Secondary | ICD-10-CM | POA: Insufficient documentation

## 2024-07-14 NOTE — Therapy (Signed)
 OUTPATIENT PHYSICAL THERAPY LOWER EXTREMITY EVALUATION   Patient Name: Anne Fitzpatrick MRN: [id number] DOB:01/25/1966, 58 y.o., female Today's Date: 07/14/2024  END OF SESSION:  PT End of Session - 07/14/24 0845     Visit Number 1    Date for Recertification  09/08/24    Authorization Type [employer]    PT Start Time 0845    PT Stop Time 0925    PT Time Calculation (min) 40 min          Past Medical History:  Diagnosis Date   Asthma    Bilateral swelling of feet    Hypothyroid    Hypothyroidism    Joint pain    Lactose intolerance    OSA (obstructive sleep apnea)    Seasonal allergies    SOB (shortness of breath)    Vitamin D  deficiency    Past Surgical History:  Procedure Laterality Date   ABDOMINAL HYSTERECTOMY     CESAREAN SECTION     1989   There are no active problems to display for this patient.   PCP: Maree Isles, MD  REFERRING PROVIDER: Maree Isles, MD  REFERRING DIAG: Osteoarthritis of lower leg  THERAPY DIAG:  Chronic pain of right knee  Stiffness of right knee, not elsewhere classified  Muscle weakness (generalized)  Other abnormalities of gait and mobility  Rationale for Evaluation and Treatment: Rehabilitation  ONSET DATE: March or April 2025  SUBJECTIVE:   SUBJECTIVE STATEMENT: I woke up one morning and my right knee was so sore. Pt states it felt like her knee was mashed for a long period. Pain just kept getting worse. States she went to urgent care and they did x-rays but didn't show anything. Pt propped the leg up and it started to feel better. Pt states she ended up standing for a long period for a funeral and she could hardly walk the next day. Went back to the doctor and received a shot but felt it got worse. MRI was eventually performed which showed that she is not completely bone on bone but was recommended she lose weight and do PT. States knee will swell on/off. It feels really tight. Knee can buckle at times.  PERTINENT  HISTORY: Obesity (going to [facility]), DM  PAIN:  Are you having pain? Yes: NPRS scale: 0 at rest, at worst 8 Pain location: R anterior to lateral knee Pain description: Constant pain but some days are better or worse; stiff Aggravating factors: States it's not consistent; first getting up she may feel stiff Relieving factors: meloxicam, lidocaine patch  PRECAUTIONS: None  RED FLAGS: None   WEIGHT BEARING RESTRICTIONS: No  FALLS:  Has patient fallen in last 6 months? 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  DIAGNOSTIC FINDINGS: MRI performed but unable to see results on Epic  PATIENT SURVEYS:  LEFS  Extreme difficulty/unable (0), Quite a bit of difficulty (1), Moderate difficulty (2), Little difficulty (3), No difficulty (4) Survey date:  07/14/24  Any of your usual work, housework or school activities 3  2. Usual hobbies, recreational or sporting activities 3  3. Getting into/out of the bath 4  4. Walking  between rooms 4  5. Putting on socks/shoes 1  6. Squatting  0  7. Lifting an object, like a bag of groceries from the floor 3  8. Performing light activities around your home 4  9. Performing heavy activities around your home 2  10. Getting into/out of a car 4  11. Walking 2 blocks 0  12. Walking 1 mile 0  13. Going up/down 10 stairs (1 flight) 2  14. Standing for 1 hour 0  15.  sitting for 1 hour 3  16. Running on even ground 0  17. Running on uneven ground 0  18. Making sharp turns while running fast 0  19. Hopping  0  20. Rolling over in bed Not answered  Score total:  35      COGNITION: Overall cognitive status: Within functional limits for tasks assessed     SENSATION: WFL  EDEMA:   Occasional swelling  MUSCLE LENGTH: Did not assess  POSTURE: R patella slightly lateral to tibiofemoral joint in comparison to L patella  PALPATION: TTP medial/anterior R knee  LOWER EXTREMITY ROM:  Active ROM Right eval Left eval  Hip flexion    Hip extension    Hip abduction    Hip adduction    Hip internal rotation    Hip external rotation    Knee flexion 85 102  Knee extension 0 0  Ankle dorsiflexion    Ankle plantarflexion    Ankle inversion    Ankle eversion     (Blank rows = not tested)  LOWER EXTREMITY MMT:  MMT Right eval Left eval  Hip flexion 3+ 4  Hip extension 3+ 4  Hip abduction 3+ 4  Hip adduction 3 4  Hip internal rotation    Hip external rotation    Knee flexion 3+ 4-  Knee extension 4 5  Ankle dorsiflexion    Ankle plantarflexion    Ankle inversion    Ankle eversion     (Blank rows = not tested)  LOWER EXTREMITY SPECIAL TESTS:  Did not assess  FUNCTIONAL TESTS:  5 times sit to stand: 14.84 sec SLS: <3 sec bilat   GAIT: Distance walked: Into clinic Assistive device utilized: None Level of assistance: Complete Independence Comments: Antalgic, lateral trunk lean during stance, wide BOS                                                                                                                                TREATMENT DATE: 07/14/24 See HEP below    PATIENT EDUCATION:  Education details: Exam findings, POC, initial HEP Person educated: Patient Education method: Explanation, Demonstration, and Handouts Education comprehension: verbalized understanding, returned demonstration, and needs further education  HOME EXERCISE PROGRAM: Access Code: [email or web address] URL: [email or web address] Date: 07/14/2024 Prepared by: Jakeria Caissie April Marie Unknown Flannigan  Exercises - Seated Heel Slide  - 3 x daily - 7 x weekly -  1-2 sets - 10 reps - Seated Active Straight-Leg  Raise  - 1 x daily - 7 x weekly - 2 sets - 10 reps - Hip Abduction with Resistance Loop  - 1 x daily - 7 x weekly - 2 sets - 10 reps - Hip Extension with Resistance Loop  - 1 x daily - 7 x weekly - 2 sets - 10 reps - Standing Hip Adduction with Counter Support  - 1 x daily - 7 x weekly - 2 sets - 10 reps  ASSESSMENT:  CLINICAL IMPRESSION: Patient is a 58 y.o. F who was seen today for physical therapy evaluation and treatment for R knee pain. Assessment is significant for gross R LE weakness with limited knee ROM affecting overall home and community mobility. Pt will benefit from PT to improve on these issues and maximize her level of function. Eventual goal is to get into a wellness/gym program.   OBJECTIVE IMPAIRMENTS: Abnormal gait, decreased activity tolerance, decreased balance, decreased coordination, decreased mobility, difficulty walking, decreased ROM, decreased strength, increased fascial restrictions, impaired flexibility, improper body mechanics, postural dysfunction, obesity, and pain.   ACTIVITY LIMITATIONS: carrying, lifting, bending, standing, squatting, sleeping, stairs, transfers, locomotion level, and caring for others  PARTICIPATION LIMITATIONS: meal prep, cleaning, driving, shopping, and community activity  PERSONAL FACTORS: Age, Fitness, Past/current experiences, and Time since onset of injury/illness/exacerbation are also affecting patient's functional outcome.   REHAB POTENTIAL: Good  CLINICAL DECISION MAKING: Evolving/moderate complexity  EVALUATION COMPLEXITY: Moderate   GOALS: Goals reviewed with patient? Yes  SHORT TERM GOALS: Target date: 08/11/2024  Pt will be ind with initial HEP Baseline: Goal status: INITIAL  2.  Pt will demo improved R knee ROM from 0 to at least 95 deg Baseline:  Goal status: INITIAL  3.  Pt will have improved 5x STS to </=12 sec to demo increased functional LE strength Baseline:   Goal status: INITIAL    LONG TERM GOALS: Target date: 09/08/2024   Pt will be ind with management and progression of HEP Baseline:  Goal status: INITIAL  2.  Pt will demo improved R knee ROM from 0 to at least 100 deg Baseline:  Goal status: INITIAL  3.  Pt will be able to perform SLS x 10 sec to demo increased LE stability and balance Baseline:  Goal status: INITIAL  4.  Pt will report improved overall pain by >/=75% Baseline:  Goal status: INITIAL  5.  Pt will have improved LEFS score to >/=47 Baseline: 35 Goal status: INITIAL     PLAN:  PT FREQUENCY: every other week or PRN due to financial constraints  PT DURATION: 8 weeks  PLANNED INTERVENTIONS: 97164- PT Re-evaluation, 97750- Physical Performance Testing, 97110-Therapeutic exercises, 97530- Therapeutic activity, 97112- Neuromuscular re-education, 97535- Self Care, 02859- Manual therapy, [id number]- Gait training, [phone]- Aquatic Therapy, [phone]- Electrical stimulation (unattended), 97016- Vasopneumatic device, [id number]- Ultrasound, [id number]- Ionotophoresis 4mg /ml Dexamethasone, 79439 (1-2 muscles), 20561 (3+ muscles)- Dry Needling, Patient/Family education, Balance training, Stair training, Taping, Joint mobilization, Cryotherapy, and Moist heat  PLAN FOR NEXT SESSION: Assess response to HEP. Gentle knee ROM. Progressive strengthening as tolerated for LEs.    Mataya Kilduff April Ma L Swayzie Choate, PT, DPT 07/14/2024, 9:44 AM

## 2024-07-14 NOTE — Progress Notes (Deleted)
 OUTPATIENT PHYSICAL THERAPY LOWER EXTREMITY EVALUATION   Patient Name: Anne Fitzpatrick MRN: [id number] DOB:1966/07/24, 58 y.o., female Today's Date: 07/14/2024  END OF SESSION:   Past Medical History:  Diagnosis Date   Asthma    Bilateral swelling of feet    Hypothyroid    Hypothyroidism    Joint pain    Lactose intolerance    OSA (obstructive sleep apnea)    Seasonal allergies    SOB (shortness of breath)    Vitamin D  deficiency    Past Surgical History:  Procedure Laterality Date   ABDOMINAL HYSTERECTOMY     CESAREAN SECTION     1989   There are no active problems to display for this patient.   PCP: ***  REFERRING PROVIDER: ***  REFERRING DIAG: ***  THERAPY DIAG:  No diagnosis found.  Rationale for Evaluation and Treatment: {HABREHAB:27488}  ONSET DATE: ***  SUBJECTIVE:   SUBJECTIVE STATEMENT: ***  PERTINENT HISTORY: *** PAIN:  Are you having pain? {OPRCPAIN:27236}  PRECAUTIONS: {Therapy precautions:24002}  RED FLAGS: {PT Red Flags:29287}   WEIGHT BEARING RESTRICTIONS: {Yes ***/No:24003}  FALLS:  Has patient fallen in last 6 months? {fallsyesno:27318}  LIVING ENVIRONMENT: Lives with: {OPRC lives with:25569::lives with their family} Lives in: {Lives in:25570} Stairs: {opstairs:27293} Has following equipment at home: {Assistive devices:23999}  OCCUPATION: ***  PLOF: {PLOF:24004}  PATIENT GOALS: ***  NEXT MD VISIT: ***  OBJECTIVE:  Note: Objective measures were completed at Evaluation unless otherwise noted.  DIAGNOSTIC FINDINGS: ***  PATIENT SURVEYS:  {rehab surveys:24030}  COGNITION: Overall cognitive status: {cognition:24006}     SENSATION: {sensation:27233}  EDEMA:  {edema:24020}  MUSCLE LENGTH: Hamstrings: Right *** deg; Left *** deg Debby test: Right *** deg; Left *** deg  POSTURE: {posture:25561}  PALPATION: ***  LOWER EXTREMITY ROM:  {AROM/PROM:27142} ROM Right eval Left eval  Hip flexion    Hip  extension    Hip abduction    Hip adduction    Hip internal rotation    Hip external rotation    Knee flexion    Knee extension    Ankle dorsiflexion    Ankle plantarflexion    Ankle inversion    Ankle eversion     (Blank rows = not tested)  LOWER EXTREMITY MMT:  MMT Right eval Left eval  Hip flexion    Hip extension    Hip abduction    Hip adduction    Hip internal rotation    Hip external rotation    Knee flexion    Knee extension    Ankle dorsiflexion    Ankle plantarflexion    Ankle inversion    Ankle eversion     (Blank rows = not tested)  LOWER EXTREMITY SPECIAL TESTS:  {LEspecialtests:26242}  FUNCTIONAL TESTS:  {Functional tests:24029}  GAIT: Distance walked: *** Assistive device utilized: {Assistive devices:23999} Level of assistance: {Levels of assistance:24026} Comments: ***  TREATMENT DATE: ***    PATIENT EDUCATION:  Education details: *** Person educated: {Person educated:25204} Education method: {Education Method:25205} Education comprehension: {Education Comprehension:25206}  HOME EXERCISE PROGRAM: ***  ASSESSMENT:  CLINICAL IMPRESSION: Patient is a *** y.o. *** who was seen today for physical therapy evaluation and treatment for ***.   OBJECTIVE IMPAIRMENTS: {opptimpairments:25111}.   ACTIVITY LIMITATIONS: {activitylimitations:27494}  PARTICIPATION LIMITATIONS: {participationrestrictions:25113}  PERSONAL FACTORS: {Personal factors:25162} are also affecting patient's functional outcome.   REHAB POTENTIAL: {rehabpotential:25112}  CLINICAL DECISION MAKING: {clinical decision making:25114}  EVALUATION COMPLEXITY: {Evaluation complexity:25115}   GOALS: Goals reviewed with patient? {yes/no:20286}  SHORT TERM GOALS: Target date: *** *** Baseline: Goal status: INITIAL  2.  *** Baseline:  Goal  status: INITIAL  3.  *** Baseline:  Goal status: INITIAL  4.  *** Baseline:  Goal status: INITIAL  5.  *** Baseline:  Goal status: INITIAL  6.  *** Baseline:  Goal status: INITIAL  LONG TERM GOALS: Target date: ***  *** Baseline:  Goal status: INITIAL  2.  *** Baseline:  Goal status: INITIAL  3.  *** Baseline:  Goal status: INITIAL  4.  *** Baseline:  Goal status: INITIAL  5.  *** Baseline:  Goal status: INITIAL  6.  *** Baseline:  Goal status: INITIAL   PLAN:  PT FREQUENCY: {rehab frequency:25116}  PT DURATION: {rehab duration:25117}  PLANNED INTERVENTIONS: {rehab planned interventions:25118::97110-Therapeutic exercises,97530- Therapeutic [phone]- Neuromuscular re-education,97535- Self Rjmz,02859- Manual therapy,Patient/Family education}  PLAN FOR NEXT SESSION: ***   Tarah Buboltz April Ma L Crissa Sowder, PT 07/14/2024, 7:58 AM

## 2024-07-21 ENCOUNTER — Ambulatory Visit: Admitting: Physical Therapy

## 2024-07-28 ENCOUNTER — Ambulatory Visit (INDEPENDENT_AMBULATORY_CARE_PROVIDER_SITE_OTHER): Admitting: Nurse Practitioner

## 2024-07-28 ENCOUNTER — Encounter (INDEPENDENT_AMBULATORY_CARE_PROVIDER_SITE_OTHER): Payer: Self-pay | Admitting: Nurse Practitioner

## 2024-07-28 VITALS — BP 119/76 | HR 70 | Temp 97.6°F | Ht 64.0 in | Wt 371.0 lb

## 2024-07-28 DIAGNOSIS — R600 Localized edema: Secondary | ICD-10-CM

## 2024-07-28 DIAGNOSIS — E785 Hyperlipidemia, unspecified: Secondary | ICD-10-CM

## 2024-07-28 DIAGNOSIS — E66813 Obesity, class 3: Secondary | ICD-10-CM | POA: Diagnosis not present

## 2024-07-28 DIAGNOSIS — E1165 Type 2 diabetes mellitus with hyperglycemia: Secondary | ICD-10-CM | POA: Diagnosis not present

## 2024-07-28 DIAGNOSIS — E1169 Type 2 diabetes mellitus with other specified complication: Secondary | ICD-10-CM

## 2024-07-28 DIAGNOSIS — E559 Vitamin D deficiency, unspecified: Secondary | ICD-10-CM

## 2024-07-28 DIAGNOSIS — Z6841 Body Mass Index (BMI) 40.0 and over, adult: Secondary | ICD-10-CM | POA: Diagnosis not present

## 2024-07-28 DIAGNOSIS — E538 Deficiency of other specified B group vitamins: Secondary | ICD-10-CM | POA: Diagnosis not present

## 2024-07-28 NOTE — Progress Notes (Signed)
 " Office: [phone]  /  Fax: [phone]  WEIGHT SUMMARY AND BIOMETRICS  Weight Lost Since Last Visit: 9 lb  Weight Gained Since Last Visit: 0   Vitals Temp: 97.6 F (36.4 C) BP: 119/76 Pulse Rate: 70 SpO2: 94 %   Anthropometric Measurements Height: 5' 4 (1.626 m) Weight: (!) 371 lb (168.3 kg) BMI (Calculated): 63.65 Weight at Last Visit: 380 lb Weight Lost Since Last Visit: 9 lb Weight Gained Since Last Visit: 0 Starting Weight: 395 lb Total Weight Loss (lbs): 24 lb (10.9 kg) Peak Weight: 395 lb Waist Measurement : 60 inches   Body Composition  Body Fat %: 65.9 % Fat Mass (lbs): 244.6 lbs Muscle Mass (lbs): 120.2 lbs Visceral Fat Rating : 31   Other Clinical Data Fasting: yes Labs: no Today's Visit #: 5 Starting Date: 04/27/24    Total Weight Loss: 24 pounds Percent of body weight lost: 6%   Bio Impedance Data reviewed with patient:Muscle is down 2.6 pounds, adipose is down 6.8 pounds. Visceral fat rating decreased 1 point from 32 to 31.   HPI  Chief Complaint: OBESITY  Hector is here to discuss her progress with her obesity treatment plan. She is on the the Category 3 Plan and states she is following her eating plan approximately 25 % of the time. She states she is exercising 15 minutes 2 days per week- chair aerobics.   Interval History:  Since last office visit she has been trying to get 100-140 grams of protein daily.She has not been skipping meals.  She has been getting at least 80 ounces of water daily Holiday party at work was healthy with salad, fruit, meatballs not allowed of sweets.  First year to have Christmas without her brother and that has been hard. She has been doing some chair aerobics. Continues to have pain and decreased ROM of right knee- has gone to PT.  SABRA   Tanish does have Type 2 DM with hyperlipidemia and is working on nutrition, exercise and weight loss to help lower glucose and lipid levels.  She does have Vit D  deficiency and was to take cholecalciferol 5000 units twice a day as this worked better in the past to raise her level than prescription Ergocalciferol  50000 units once a week. She has not yet started Last vitamin D  Lab Results  Component Value Date   VD25OH 22.4 (L) 04/27/2024    She was started on Furosemide  20 mg every other day at last visit for lower leg edema. She has been taking it as needed, has taken 3 so far. No cramping from medication.  She is wearing compression socks and is also helping her edema   PHYSICAL EXAM:  Blood pressure 119/76, pulse 70, temperature 97.6 F (36.4 C), height 5' 4 (1.626 m), weight (!) 371 lb (168.3 kg), SpO2 94%. Body mass index is 63.68 kg/m.  General: Well Developed, well nourished, and in no acute distress.  HEENT: Normocephalic, atraumatic; EOMI, sclerae are anicteric. Skin: Warm and dry, good turgor Chest:  Normal excursion, shape, no gross ABN Respiratory: No conversational dyspnea; speaking in full sentences NeuroM-Sk:  Normal gross ROM * 4 extremities  Psych: A and O X 3, insight adequate, mood- full    DIAGNOSTIC DATA REVIEWED:  BMET    Component Value Date/Time   NA 142 04/27/2024 1050   K 4.2 04/27/2024 1050   CL 106 04/27/2024 1050   CO2 25 04/27/2024 1050   GLUCOSE 87 04/27/2024 1050   GLUCOSE 94  05/24/2011 0015   BUN 9 04/27/2024 1050   CREATININE 0.71 04/27/2024 1050   CALCIUM 9.1 04/27/2024 1050   GFRNONAA >90 05/24/2011 0015   GFRAA >90 05/24/2011 0015   No results found for: HGBA1C Lab Results  Component Value Date   INSULIN  26.1 (H) 04/27/2024   Lab Results  Component Value Date   TSH 2.760 04/27/2024   CBC    Component Value Date/Time   WBC 4.2 04/27/2024 1050   WBC 5.6 05/24/2011 0015   RBC 4.09 04/27/2024 1050   RBC 4.54 05/24/2011 0015   HGB 11.9 04/27/2024 1050   HCT 37.6 04/27/2024 1050   PLT 243 04/27/2024 1050   MCV 92 04/27/2024 1050   MCH 29.1 04/27/2024 1050   MCH 28.0 05/24/2011  0015   MCHC 31.6 04/27/2024 1050   MCHC 31.8 05/24/2011 0015   RDW 12.9 04/27/2024 1050   Iron Studies No results found for: IRON, TIBC, FERRITIN, IRONPCTSAT Lipid Panel  No results found for: CHOL, TRIG, HDL, CHOLHDL, VLDL, LDLCALC, LDLDIRECT Hepatic Function Panel     Component Value Date/Time   PROT 6.4 04/27/2024 1050   ALBUMIN 3.8 04/27/2024 1050   AST 14 04/27/2024 1050   ALT 14 04/27/2024 1050   ALKPHOS 126 04/27/2024 1050   BILITOT 0.3 04/27/2024 1050      Component Value Date/Time   TSH 2.760 04/27/2024 1050   Nutritional Lab Results  Component Value Date   VD25OH 22.4 (L) 04/27/2024     ASSESSMENT AND PLAN  TREATMENT PLAN FOR OBESITY:  Recommended Dietary Goals  Kyler is currently in the action stage of change. As such, her goal is to continue weight management plan. She has agreed to the Category 3 Plan.  Behavioral Intervention  We discussed the following Behavioral Modification Strategies today: increasing lean protein intake to established goals, increasing fiber rich foods, avoiding skipping meals, increasing water intake , decreasing eating out or consumption of processed foods, and making healthy choices when eating convenient foods, continue to work on maintaining a reduced calorie state, getting the recommended amount of protein, incorporating whole foods, making healthy choices, staying well hydrated and practicing mindfulness when eating., and increase protein intake, fibrous foods (25 grams per day for women, 30 grams for men) and water to improve satiety and decrease hunger signals. .  Additional resources provided today: NA  Recommended Physical Activity Goals  Eve has been advised to work up to 150 minutes of moderate intensity aerobic activity a week and strengthening exercises 2-3 times per week for cardiovascular health, weight loss maintenance and preservation of muscle mass.   She has agreed to Patient will begin  to exercise chair aerobics 2-3 days a week depending on knee pain   Pharmacotherapy We discussed various medication options to help Nitasha with her weight loss efforts and we both agreed to continue nutrition and behavior modification. .  ASSOCIATED CONDITIONS ADDRESSED TODAY  Action/Plan  Type 2 diabetes mellitus with hyperglycemia, without long-term current use of insulin  (HCC) Continue Category 3  meal plan, limit simple carbohydrates.Continue to increase lean protein, water and fiber Decreasing body weight by 10-15% can improve glucose levels- has lost 6% of body weight Continue exercise with current goal of 150 minutes of moderate to high intensity exercise/week.  -     Basic metabolic panel with GFR  Type 2 diabetes mellitus with hyperlipidemia (HCC) Focus on implementing category 3 meal plan, limit saturated fats. Continue to increase lean protein, water and fiber Continue to follow  regularly with PCP Focus on getting 150 minutes a week of moderate to high intensity exercise   Type 2 diabetes mellitus associated with morbid obesity (HCC) Continue Category 3  meal plan, limit simple carbohydrates.Continue to increase lean protein, water and fiber -     Basic metabolic panel with GFR  Localized edema Continue to use Lasix  20 mg PRN and wear compression stockings daily -     Basic metabolic panel with GFR  Vitamin D  deficiency Low vitamin D  levels can be associated with adiposity and may result in leptin resistance and weight gain. Also associated with fatigue.  Currently on vitamin D  supplementation without any adverse effects such as nausea, vomiting or muscle weakness.  If level remains decreased today will stress the importance of increasing supplementation- did not up to cholecalciferol 5000 units to BID -     VITAMIN D  25 Hydroxy (Vit-D Deficiency, Fractures)  Class 3 severe obesity with serious comorbidity and body mass index (BMI) of 60.0 to 69.9 in adult, unspecified  obesity type (HCC) See plan above -     Cortisol  B12 deficiency Continue supplementation and recheck level today -     Vitamin B12         No follow-ups on file.SABRA She was informed of the importance of frequent follow up visits to maximize her success with intensive lifestyle modifications for her multiple health conditions.   ATTESTASTION STATEMENTS:  Reviewed by clinician on day of visit: allergies, medications, problem list, medical history, surgical history, family history, social history, and previous encounter notes.     Lonell Liverpool ANP-C "

## 2024-07-29 LAB — BASIC METABOLIC PANEL WITH GFR
BUN/Creatinine Ratio: 12 (ref 9–23)
BUN: 10 mg/dL (ref 6–24)
CO2: 24 mmol/L (ref 20–29)
Calcium: 9.2 mg/dL (ref 8.7–10.2)
Chloride: 105 mmol/L (ref 96–106)
Creatinine, Ser: 0.84 mg/dL (ref 0.57–1.00)
Glucose: 90 mg/dL (ref 70–99)
Potassium: 4.4 mmol/L (ref 3.5–5.2)
Sodium: 142 mmol/L (ref 134–144)
eGFR: 82 mL/min/1.73

## 2024-07-29 LAB — VITAMIN B12: Vitamin B-12: 557 pg/mL (ref 232–1245)

## 2024-07-29 LAB — CORTISOL: Cortisol: 7.3 ug/dL (ref 6.2–19.4)

## 2024-07-29 LAB — VITAMIN D 25 HYDROXY (VIT D DEFICIENCY, FRACTURES): Vit D, 25-Hydroxy: 29.3 ng/mL — ABNORMAL LOW (ref 30.0–100.0)

## 2024-08-02 ENCOUNTER — Ambulatory Visit (INDEPENDENT_AMBULATORY_CARE_PROVIDER_SITE_OTHER): Payer: Self-pay | Admitting: Nurse Practitioner

## 2024-08-05 ENCOUNTER — Ambulatory Visit: Attending: Internal Medicine | Admitting: Physical Therapy

## 2024-08-17 ENCOUNTER — Ambulatory Visit

## 2024-08-26 ENCOUNTER — Ambulatory Visit (INDEPENDENT_AMBULATORY_CARE_PROVIDER_SITE_OTHER): Admitting: Nurse Practitioner

## 2024-08-26 ENCOUNTER — Ambulatory Visit: Admitting: Physical Therapy

## 2024-09-14 ENCOUNTER — Ambulatory Visit (INDEPENDENT_AMBULATORY_CARE_PROVIDER_SITE_OTHER): Admitting: Nurse Practitioner
